# Patient Record
Sex: Male | Born: 1950
Health system: Southern US, Community
[De-identification: ages and names within clinical notes are randomized; demographics above are authoritative.]

## PROBLEM LIST (undated history)

## (undated) DIAGNOSIS — F419 Anxiety disorder, unspecified: Secondary | ICD-10-CM

## (undated) HISTORY — PX: FINGER AMPUTATION: SHX636

---

## 2005-05-16 ENCOUNTER — Inpatient Hospital Stay: Payer: Self-pay | Admitting: Internal Medicine

## 2005-05-16 ENCOUNTER — Other Ambulatory Visit: Payer: Self-pay

## 2007-04-05 ENCOUNTER — Ambulatory Visit: Payer: Self-pay | Admitting: Internal Medicine

## 2009-01-27 ENCOUNTER — Ambulatory Visit: Payer: Self-pay | Admitting: Nephrology

## 2011-08-16 DIAGNOSIS — B351 Tinea unguium: Secondary | ICD-10-CM | POA: Diagnosis not present

## 2011-08-16 DIAGNOSIS — M779 Enthesopathy, unspecified: Secondary | ICD-10-CM | POA: Diagnosis not present

## 2011-11-06 DIAGNOSIS — Z20828 Contact with and (suspected) exposure to other viral communicable diseases: Secondary | ICD-10-CM | POA: Diagnosis not present

## 2011-11-20 DIAGNOSIS — B351 Tinea unguium: Secondary | ICD-10-CM | POA: Diagnosis not present

## 2011-11-20 DIAGNOSIS — L97509 Non-pressure chronic ulcer of other part of unspecified foot with unspecified severity: Secondary | ICD-10-CM | POA: Diagnosis not present

## 2013-08-05 DIAGNOSIS — Z79899 Other long term (current) drug therapy: Secondary | ICD-10-CM | POA: Diagnosis not present

## 2013-11-12 DIAGNOSIS — F17200 Nicotine dependence, unspecified, uncomplicated: Secondary | ICD-10-CM | POA: Diagnosis not present

## 2013-11-12 DIAGNOSIS — IMO0001 Reserved for inherently not codable concepts without codable children: Secondary | ICD-10-CM | POA: Diagnosis not present

## 2014-06-18 DIAGNOSIS — Z720 Tobacco use: Secondary | ICD-10-CM | POA: Diagnosis not present

## 2014-06-18 DIAGNOSIS — S68119A Complete traumatic metacarpophalangeal amputation of unspecified finger, initial encounter: Secondary | ICD-10-CM | POA: Diagnosis not present

## 2015-01-20 DIAGNOSIS — I739 Peripheral vascular disease, unspecified: Secondary | ICD-10-CM | POA: Diagnosis not present

## 2015-01-20 DIAGNOSIS — E785 Hyperlipidemia, unspecified: Secondary | ICD-10-CM | POA: Diagnosis not present

## 2015-01-20 DIAGNOSIS — R0602 Shortness of breath: Secondary | ICD-10-CM | POA: Diagnosis not present

## 2015-01-20 DIAGNOSIS — Z720 Tobacco use: Secondary | ICD-10-CM | POA: Diagnosis not present

## 2015-02-12 DIAGNOSIS — R0602 Shortness of breath: Secondary | ICD-10-CM | POA: Diagnosis not present

## 2015-02-19 DIAGNOSIS — R0602 Shortness of breath: Secondary | ICD-10-CM | POA: Diagnosis not present

## 2015-02-19 DIAGNOSIS — E559 Vitamin D deficiency, unspecified: Secondary | ICD-10-CM | POA: Diagnosis not present

## 2015-02-19 DIAGNOSIS — E785 Hyperlipidemia, unspecified: Secondary | ICD-10-CM | POA: Diagnosis not present

## 2015-02-19 DIAGNOSIS — R7309 Other abnormal glucose: Secondary | ICD-10-CM | POA: Diagnosis not present

## 2015-02-19 DIAGNOSIS — Z720 Tobacco use: Secondary | ICD-10-CM | POA: Diagnosis not present

## 2015-02-19 DIAGNOSIS — I739 Peripheral vascular disease, unspecified: Secondary | ICD-10-CM | POA: Diagnosis not present

## 2015-05-11 DIAGNOSIS — E559 Vitamin D deficiency, unspecified: Secondary | ICD-10-CM | POA: Diagnosis not present

## 2015-05-11 DIAGNOSIS — R7301 Impaired fasting glucose: Secondary | ICD-10-CM | POA: Diagnosis not present

## 2015-05-11 DIAGNOSIS — I10 Essential (primary) hypertension: Secondary | ICD-10-CM | POA: Diagnosis not present

## 2015-05-11 DIAGNOSIS — R0602 Shortness of breath: Secondary | ICD-10-CM | POA: Diagnosis not present

## 2015-05-11 DIAGNOSIS — Z720 Tobacco use: Secondary | ICD-10-CM | POA: Diagnosis not present

## 2015-05-11 DIAGNOSIS — J45909 Unspecified asthma, uncomplicated: Secondary | ICD-10-CM | POA: Diagnosis not present

## 2015-05-11 DIAGNOSIS — E785 Hyperlipidemia, unspecified: Secondary | ICD-10-CM | POA: Diagnosis not present

## 2015-05-11 DIAGNOSIS — Z23 Encounter for immunization: Secondary | ICD-10-CM | POA: Diagnosis not present

## 2015-05-11 DIAGNOSIS — R7309 Other abnormal glucose: Secondary | ICD-10-CM | POA: Diagnosis not present

## 2015-05-11 DIAGNOSIS — E7211 Homocystinuria: Secondary | ICD-10-CM | POA: Diagnosis not present

## 2015-06-18 DIAGNOSIS — E7211 Homocystinuria: Secondary | ICD-10-CM | POA: Diagnosis not present

## 2015-06-18 DIAGNOSIS — R7301 Impaired fasting glucose: Secondary | ICD-10-CM | POA: Diagnosis not present

## 2015-06-18 DIAGNOSIS — E785 Hyperlipidemia, unspecified: Secondary | ICD-10-CM | POA: Diagnosis not present

## 2015-06-18 DIAGNOSIS — Z720 Tobacco use: Secondary | ICD-10-CM | POA: Diagnosis not present

## 2015-06-18 DIAGNOSIS — I10 Essential (primary) hypertension: Secondary | ICD-10-CM | POA: Diagnosis not present

## 2015-06-18 DIAGNOSIS — J45909 Unspecified asthma, uncomplicated: Secondary | ICD-10-CM | POA: Diagnosis not present

## 2015-06-18 DIAGNOSIS — E559 Vitamin D deficiency, unspecified: Secondary | ICD-10-CM | POA: Diagnosis not present

## 2015-10-07 DIAGNOSIS — R7303 Prediabetes: Secondary | ICD-10-CM | POA: Diagnosis not present

## 2015-10-07 DIAGNOSIS — J45909 Unspecified asthma, uncomplicated: Secondary | ICD-10-CM | POA: Diagnosis not present

## 2015-10-07 DIAGNOSIS — E7211 Homocystinuria: Secondary | ICD-10-CM | POA: Diagnosis not present

## 2015-10-07 DIAGNOSIS — M545 Low back pain: Secondary | ICD-10-CM | POA: Diagnosis not present

## 2015-10-07 DIAGNOSIS — E785 Hyperlipidemia, unspecified: Secondary | ICD-10-CM | POA: Diagnosis not present

## 2015-10-07 DIAGNOSIS — I10 Essential (primary) hypertension: Secondary | ICD-10-CM | POA: Diagnosis not present

## 2015-10-07 DIAGNOSIS — E559 Vitamin D deficiency, unspecified: Secondary | ICD-10-CM | POA: Diagnosis not present

## 2015-10-07 DIAGNOSIS — Z720 Tobacco use: Secondary | ICD-10-CM | POA: Diagnosis not present

## 2016-08-03 DIAGNOSIS — I10 Essential (primary) hypertension: Secondary | ICD-10-CM | POA: Diagnosis not present

## 2016-08-03 DIAGNOSIS — E559 Vitamin D deficiency, unspecified: Secondary | ICD-10-CM | POA: Diagnosis not present

## 2016-08-03 DIAGNOSIS — Z131 Encounter for screening for diabetes mellitus: Secondary | ICD-10-CM | POA: Diagnosis not present

## 2016-08-03 DIAGNOSIS — Z720 Tobacco use: Secondary | ICD-10-CM | POA: Diagnosis not present

## 2016-08-03 DIAGNOSIS — Z23 Encounter for immunization: Secondary | ICD-10-CM | POA: Diagnosis not present

## 2016-08-03 DIAGNOSIS — Z5181 Encounter for therapeutic drug level monitoring: Secondary | ICD-10-CM | POA: Diagnosis not present

## 2016-08-03 DIAGNOSIS — H538 Other visual disturbances: Secondary | ICD-10-CM | POA: Diagnosis not present

## 2016-08-03 DIAGNOSIS — Z0000 Encounter for general adult medical examination without abnormal findings: Secondary | ICD-10-CM | POA: Diagnosis not present

## 2016-08-03 DIAGNOSIS — J449 Chronic obstructive pulmonary disease, unspecified: Secondary | ICD-10-CM | POA: Diagnosis not present

## 2016-08-03 DIAGNOSIS — E785 Hyperlipidemia, unspecified: Secondary | ICD-10-CM | POA: Diagnosis not present

## 2016-08-03 DIAGNOSIS — Z113 Encounter for screening for infections with a predominantly sexual mode of transmission: Secondary | ICD-10-CM | POA: Diagnosis not present

## 2016-08-03 DIAGNOSIS — R7303 Prediabetes: Secondary | ICD-10-CM | POA: Diagnosis not present

## 2016-08-25 DIAGNOSIS — E7211 Homocystinuria: Secondary | ICD-10-CM | POA: Diagnosis not present

## 2016-08-25 DIAGNOSIS — M545 Low back pain: Secondary | ICD-10-CM | POA: Diagnosis not present

## 2016-08-25 DIAGNOSIS — E559 Vitamin D deficiency, unspecified: Secondary | ICD-10-CM | POA: Diagnosis not present

## 2016-08-25 DIAGNOSIS — I10 Essential (primary) hypertension: Secondary | ICD-10-CM | POA: Diagnosis not present

## 2016-08-25 DIAGNOSIS — Z720 Tobacco use: Secondary | ICD-10-CM | POA: Diagnosis not present

## 2016-08-25 DIAGNOSIS — E785 Hyperlipidemia, unspecified: Secondary | ICD-10-CM | POA: Diagnosis not present

## 2016-08-25 DIAGNOSIS — R7303 Prediabetes: Secondary | ICD-10-CM | POA: Diagnosis not present

## 2016-08-25 DIAGNOSIS — J45909 Unspecified asthma, uncomplicated: Secondary | ICD-10-CM | POA: Diagnosis not present

## 2016-08-25 DIAGNOSIS — R0600 Dyspnea, unspecified: Secondary | ICD-10-CM | POA: Diagnosis not present

## 2016-09-04 DIAGNOSIS — G44221 Chronic tension-type headache, intractable: Secondary | ICD-10-CM | POA: Diagnosis not present

## 2016-09-11 DIAGNOSIS — R51 Headache: Secondary | ICD-10-CM | POA: Diagnosis not present

## 2016-09-15 DIAGNOSIS — R7303 Prediabetes: Secondary | ICD-10-CM | POA: Diagnosis not present

## 2016-09-15 DIAGNOSIS — E7211 Homocystinuria: Secondary | ICD-10-CM | POA: Diagnosis not present

## 2016-09-15 DIAGNOSIS — M545 Low back pain: Secondary | ICD-10-CM | POA: Diagnosis not present

## 2016-09-15 DIAGNOSIS — Z720 Tobacco use: Secondary | ICD-10-CM | POA: Diagnosis not present

## 2016-09-15 DIAGNOSIS — E559 Vitamin D deficiency, unspecified: Secondary | ICD-10-CM | POA: Diagnosis not present

## 2016-09-15 DIAGNOSIS — J45909 Unspecified asthma, uncomplicated: Secondary | ICD-10-CM | POA: Diagnosis not present

## 2016-09-15 DIAGNOSIS — I10 Essential (primary) hypertension: Secondary | ICD-10-CM | POA: Diagnosis not present

## 2016-09-15 DIAGNOSIS — E785 Hyperlipidemia, unspecified: Secondary | ICD-10-CM | POA: Diagnosis not present

## 2016-10-31 DIAGNOSIS — G44221 Chronic tension-type headache, intractable: Secondary | ICD-10-CM | POA: Diagnosis not present

## 2017-06-14 DIAGNOSIS — Z01118 Encounter for examination of ears and hearing with other abnormal findings: Secondary | ICD-10-CM | POA: Diagnosis not present

## 2017-06-14 DIAGNOSIS — H538 Other visual disturbances: Secondary | ICD-10-CM | POA: Diagnosis not present

## 2017-06-14 DIAGNOSIS — Z720 Tobacco use: Secondary | ICD-10-CM | POA: Diagnosis not present

## 2017-06-14 DIAGNOSIS — Z113 Encounter for screening for infections with a predominantly sexual mode of transmission: Secondary | ICD-10-CM | POA: Diagnosis not present

## 2017-06-14 DIAGNOSIS — Z5181 Encounter for therapeutic drug level monitoring: Secondary | ICD-10-CM | POA: Diagnosis not present

## 2017-06-14 DIAGNOSIS — E785 Hyperlipidemia, unspecified: Secondary | ICD-10-CM | POA: Diagnosis not present

## 2017-06-14 DIAGNOSIS — Z136 Encounter for screening for cardiovascular disorders: Secondary | ICD-10-CM | POA: Diagnosis not present

## 2017-06-14 DIAGNOSIS — R7303 Prediabetes: Secondary | ICD-10-CM | POA: Diagnosis not present

## 2017-06-14 DIAGNOSIS — Z131 Encounter for screening for diabetes mellitus: Secondary | ICD-10-CM | POA: Diagnosis not present

## 2017-08-06 DIAGNOSIS — R7303 Prediabetes: Secondary | ICD-10-CM | POA: Diagnosis not present

## 2017-08-06 DIAGNOSIS — Z0000 Encounter for general adult medical examination without abnormal findings: Secondary | ICD-10-CM | POA: Diagnosis not present

## 2017-08-06 DIAGNOSIS — E785 Hyperlipidemia, unspecified: Secondary | ICD-10-CM | POA: Diagnosis not present

## 2017-08-06 DIAGNOSIS — Z720 Tobacco use: Secondary | ICD-10-CM | POA: Diagnosis not present

## 2017-09-04 DIAGNOSIS — Z720 Tobacco use: Secondary | ICD-10-CM | POA: Diagnosis not present

## 2017-09-04 DIAGNOSIS — M25512 Pain in left shoulder: Secondary | ICD-10-CM | POA: Diagnosis not present

## 2017-09-04 DIAGNOSIS — R7303 Prediabetes: Secondary | ICD-10-CM | POA: Diagnosis not present

## 2017-09-04 DIAGNOSIS — E785 Hyperlipidemia, unspecified: Secondary | ICD-10-CM | POA: Diagnosis not present

## 2017-09-05 ENCOUNTER — Emergency Department (HOSPITAL_COMMUNITY)
Admission: EM | Admit: 2017-09-05 | Discharge: 2017-09-05 | Disposition: A | Discharge status: Home / Self Care | Payer: Medicare Other | Attending: Emergency Medicine | Admitting: Emergency Medicine

## 2017-09-05 ENCOUNTER — Emergency Department (HOSPITAL_COMMUNITY): Payer: Medicare Other

## 2017-09-05 ENCOUNTER — Encounter (HOSPITAL_COMMUNITY): Payer: Self-pay | Admitting: Emergency Medicine

## 2017-09-05 DIAGNOSIS — M25512 Pain in left shoulder: Secondary | ICD-10-CM | POA: Diagnosis not present

## 2017-09-05 DIAGNOSIS — R030 Elevated blood-pressure reading, without diagnosis of hypertension: Secondary | ICD-10-CM | POA: Diagnosis not present

## 2017-09-05 DIAGNOSIS — M79602 Pain in left arm: Secondary | ICD-10-CM | POA: Diagnosis present

## 2017-09-05 DIAGNOSIS — F17210 Nicotine dependence, cigarettes, uncomplicated: Secondary | ICD-10-CM | POA: Diagnosis not present

## 2017-09-05 DIAGNOSIS — M79641 Pain in right hand: Secondary | ICD-10-CM | POA: Diagnosis not present

## 2017-09-05 DIAGNOSIS — M79622 Pain in left upper arm: Secondary | ICD-10-CM | POA: Diagnosis not present

## 2017-09-05 MED ORDER — ACETAMINOPHEN 500 MG PO TABS
500.0000 mg | ORAL_TABLET | Freq: Once | ORAL | Status: AC
  Administered 2017-09-05: 500 mg via ORAL
  Filled 2017-09-05: qty 1

## 2017-09-05 NOTE — Discharge Instructions (Signed)
Schedule an appointment with your primary care provider to follow up on your ongoing pain.  Take tylenol every 4 hours as needed for pain. Apply ice or heat as needed for pain.

## 2017-09-05 NOTE — ED Provider Notes (Signed)
[facility]   CSN: [id number] Arrival date & time: 09/05/17  0205     History   Chief Complaint Chief Complaint  Patient presents with  . Hand Pain  . Arm Pain    HPI Chase Carter is a 67 y.o. male sending to the ED for chronic left arm and right hand pain. States pain is worse with movement and palpation. Patient states he was seen by his PCP earlier in the day, however was unable to report any recommendations.  He denies fever or chills,   Has not tried any medications for his symptoms.  No recent injuries or wounds.  The history is provided by the patient.    History reviewed. No pertinent past medical history.  There are no active problems to display for this patient.   History reviewed. No pertinent surgical history.     Home Medications    Prior to Admission medications   Not on File    Family History No family history on file.  Social History Social History   Tobacco Use  . Smoking status: Current Every Day Smoker  . Smokeless tobacco: Never Used  Substance Use Topics  . Alcohol use: Yes  . Drug use: Yes     Allergies   Patient has no known allergies.   Review of Systems Review of Systems  Constitutional: Negative for fever.  Musculoskeletal: Positive for myalgias.  Skin: Negative for color change and wound.  All other systems reviewed and are negative.    Physical Exam Updated Vital Signs BP (!) 143/96 (BP Location: Right Arm)   Pulse 76   Temp 98 F (36.7 C) (Oral)   Resp 18   Ht 6\' 1"  (1.854 m)   Wt 76.2 kg (168 lb)   SpO2 100%   BMI 22.16 kg/m   Physical Exam  Constitutional: He appears well-developed and well-nourished. No distress.  HENT:  Head: Normocephalic and atraumatic.  Eyes: Conjunctivae are normal.  Cardiovascular: Normal rate and intact distal pulses.  Pulmonary/Chest: Effort normal.  Musculoskeletal:  Old Partial amputations of digits 3-5 on right  hand. No erythema, edema, or warmth. Nl ROM. Left arm without tenderness or deformity. Normal ROM of shoulder, elbow and wrist. Normal sensation  Psychiatric: He has a normal mood and affect. His behavior is normal.  Nursing note and vitals reviewed.    ED Treatments / Results  Labs (all labs ordered are listed, but only abnormal results are displayed) Labs Reviewed - No data to display  EKG  EKG Interpretation None       Radiology Dg Humerus Left  Result Date: 09/05/2017 CLINICAL DATA:  Left arm pain for several months EXAM: LEFT HUMERUS - 2+ VIEW COMPARISON:  None. FINDINGS: There is no evidence of fracture or other focal bone lesions. Soft tissues are unremarkable. Glenohumeral degenerative change. IMPRESSION: Negative. Electronically Signed   By: Jasmine PangKim  Fujinaga M.D.   On: 09/05/2017 02:53   Dg Hand Complete Right  Result Date: 09/05/2017 CLINICAL DATA:  Pain for several months EXAM: RIGHT HAND - COMPLETE 3+ VIEW COMPARISON:  None. FINDINGS: No acute displaced fracture or malalignment is seen. Partial amputation of the second through fifth digits, at the level of the proximal distal phalanx of the second digit and the proximal aspect of the middle phalanges third through fifth digits. Punctate densities are present within the distal fifth digit soft tissues. No periostitis. No soft tissue gas. IMPRESSION: 1. No acute osseous abnormality 2. Prior  partial amputation of the second through fifth digits. Electronically Signed   By: Jasmine Pang M.D.   On: 09/05/2017 02:52    Procedures Procedures (including critical care time)  Medications Ordered in ED Medications  acetaminophen (TYLENOL) tablet 500 mg (500 mg Oral Given 09/05/17 0405)     Initial Impression / Assessment and Plan / ED Course  I have reviewed the triage vital signs and the nursing notes.  Pertinent labs & imaging results that were available during my care of the patient were reviewed by me and considered in my  medical decision making (see chart for details).     Patient with chronic right hand and left arm pain.  She was just seen by his PCP on Tuesday afternoon.  No new injuries.  No wounds.  Exam reassuring, with normal range of motion.  X-rays negative for acute pathology.  Discussed symptomatic management for pain, and PCP follow-up.  Pain treated in the ED with Tylenol.  Safe for discharge.  Discussed with Dr. Nicanor Alcon.  Discussed results, findings, treatment and follow up. Patient advised of return precautions. Patient verbalized understanding and agreed with plan.   Final Clinical Impressions(s) / ED Diagnoses   Final diagnoses:  Left upper arm pain  Right hand pain    ED Discharge Orders    None       Tateanna Bach, [place] N, PA-C 09/05/17 0981    Palumbo, April, MD 09/05/17 1914

## 2017-09-05 NOTE — ED Triage Notes (Signed)
Pt reports R hand pain and L arm pain xseveral months. States he's tired of it and wants to be checked out. States off his psych meds, using crack.

## 2017-09-05 NOTE — ED Notes (Signed)
Patient c/o pain in right hand, states he has an accident years ago and amputated several fingers, tips of fingers dark.

## 2017-09-09 ENCOUNTER — Emergency Department (HOSPITAL_COMMUNITY)
Admission: EM | Admit: 2017-09-09 | Discharge: 2017-09-10 | Disposition: A | Discharge status: Home / Self Care | Payer: Medicare Other | Attending: Emergency Medicine | Admitting: Emergency Medicine

## 2017-09-09 DIAGNOSIS — R252 Cramp and spasm: Secondary | ICD-10-CM | POA: Diagnosis not present

## 2017-09-09 DIAGNOSIS — M79641 Pain in right hand: Secondary | ICD-10-CM | POA: Diagnosis not present

## 2017-09-09 DIAGNOSIS — M79642 Pain in left hand: Secondary | ICD-10-CM | POA: Diagnosis not present

## 2017-09-09 DIAGNOSIS — M79643 Pain in unspecified hand: Secondary | ICD-10-CM | POA: Diagnosis not present

## 2017-09-09 DIAGNOSIS — F17200 Nicotine dependence, unspecified, uncomplicated: Secondary | ICD-10-CM | POA: Diagnosis not present

## 2017-09-09 DIAGNOSIS — R52 Pain, unspecified: Secondary | ICD-10-CM | POA: Diagnosis not present

## 2017-09-09 NOTE — ED Triage Notes (Signed)
Pt transported from home by EMS for c/o bilat hand pain, pt does have old injury. Good CMS per EMS. Pt seen for same a few days ago.

## 2017-09-10 ENCOUNTER — Encounter (HOSPITAL_COMMUNITY): Payer: Self-pay | Admitting: Emergency Medicine

## 2017-09-10 DIAGNOSIS — M79642 Pain in left hand: Secondary | ICD-10-CM | POA: Diagnosis not present

## 2017-09-10 DIAGNOSIS — M79641 Pain in right hand: Secondary | ICD-10-CM | POA: Diagnosis not present

## 2017-09-10 DIAGNOSIS — R252 Cramp and spasm: Secondary | ICD-10-CM | POA: Diagnosis not present

## 2017-09-10 LAB — I-STAT CHEM 8, ED
BUN: 4 mg/dL — AB (ref 6–20)
CALCIUM ION: 1.09 mmol/L — AB (ref 1.15–1.40)
CREATININE: 1.1 mg/dL (ref 0.61–1.24)
Chloride: 101 mmol/L (ref 101–111)
Glucose, Bld: 82 mg/dL (ref 65–99)
HEMATOCRIT: 44 % (ref 39.0–52.0)
Hemoglobin: 15 g/dL (ref 13.0–17.0)
Potassium: 3.8 mmol/L (ref 3.5–5.1)
Sodium: 140 mmol/L (ref 135–145)
TCO2: 27 mmol/L (ref 22–32)

## 2017-09-10 NOTE — Discharge Instructions (Signed)
Make sure to stay hydrated.  Can take tylenol/motrin as needed for pain/cramping. Follow-up with your primary care doctor. Return here for new concerns.

## 2017-09-10 NOTE — ED Provider Notes (Signed)
[facility]   CSN: [id number] Arrival date & time: 09/09/17  2343     History   Chief Complaint Chief Complaint  Patient presents with  . Hand Pain    HPI Kieth BrightlyRoosevelt Dittmar is a 67 y.o. male.  The history is provided by the patient and medical records.     67 year old male here with bilateral hand cramping.  Has history of chronic hand pain, has seen PCP for this recently.  States he was sitting outside on the porch and got pretty severe cramps in both hands.  He thought this was due to the cold so he went back inside and had continued cramping.  States he thought it was because he has not been eating enough salt so he went and ate some potato chips.  States he is just concerned about his hand cramping.  States he has been drinking plenty of fluids lately.  He is not sure if he is ever had any history of hypokalemia.  He denies any fever or chills.  No chest pain or shortness of breath.  History reviewed. No pertinent past medical history.  There are no active problems to display for this patient.   Past Surgical History:  Procedure Laterality Date  . FINGER AMPUTATION         Home Medications    Prior to Admission medications   Not on File    Family History No family history on file.  Social History Social History   Tobacco Use  . Smoking status: Current Every Day Smoker  . Smokeless tobacco: Never Used  Substance Use Topics  . Alcohol use: Yes  . Drug use: Yes     Allergies   Patient has no known allergies.   Review of Systems Review of Systems  Musculoskeletal: Positive for arthralgias.  All other systems reviewed and are negative.    Physical Exam Updated Vital Signs BP (!) 132/92 (BP Location: Right Arm)   Pulse 80   Resp 15   Ht 6\' 1"  (1.854 m)   Wt 76.2 kg (168 lb)   SpO2 100%   BMI 22.16 kg/m   Physical Exam  Constitutional: He is oriented to person, place, and time. He  appears well-developed and well-nourished.  HENT:  Head: Normocephalic and atraumatic.  Mouth/Throat: Oropharynx is clear and moist.  No dentition  Eyes: Conjunctivae and EOM are normal. Pupils are equal, round, and reactive to light.  Neck: Normal range of motion.  Cardiovascular: Normal rate, regular rhythm and normal heart sounds.  Pulmonary/Chest: Effort normal and breath sounds normal.  Abdominal: Soft. Bowel sounds are normal.  Musculoskeletal: Normal range of motion.  Right hand with amputations of distal phalanx of 3rd, 4th, and 5th digits No acute deformities noted of either hand, no signs of trauma, able to flex and extend his fingers without issue; able to make fists and grip objects with both hands; able to push/pull against resistance; no wrist drop; radial pulses intact bilaterally  Neurological: He is alert and oriented to person, place, and time.  Skin: Skin is warm and dry.  Psychiatric: He has a normal mood and affect.  Nursing note and vitals reviewed.    ED Treatments / Results  Labs (all labs ordered are listed, but only abnormal results are displayed) Labs Reviewed  I-STAT CHEM 8, ED - Abnormal; Notable for the following components:      Result Value   BUN 4 (*)    Calcium, Ion  1.09 (*)    All other components within normal limits    EKG  EKG Interpretation None       Radiology No results found.  Procedures Procedures (including critical care time)  Medications Ordered in ED Medications - No data to display   Initial Impression / Assessment and Plan / ED Course  I have reviewed the triage vital signs and the nursing notes.  Pertinent labs & imaging results that were available during my care of the patient were reviewed by me and considered in my medical decision making (see chart for details).  67 year old male here with bilateral hand cramping.  He has no focal numbness, weakness, or other neurologic deficit on exam.  He does have some  degree of chronic hand pain due to multiple amputations of his fingers of the right hand.  There is no apparent wrist drop and has no noted spasm, tremor, or other irregular movements of the hand.  Chemistry panel was obtained, electrolytes are overall normal.  He does not appear dehydrated.  Suspect this may be acute on chronic pain.  Feel he can be discharged home with supportive care measures.  Follow-up with PCP.  Discussed plan with patient, he acknowledged understanding and agreed with plan of care.  Return precautions given for new or worsening symptoms.  Final Clinical Impressions(s) / ED Diagnoses   Final diagnoses:  Cramping of hands    ED Discharge Orders    None       Garlon Hatchet, PA-C 09/10/17 0300    Devoria Albe, MD 09/10/17 [phone]

## 2017-10-01 DIAGNOSIS — J45909 Unspecified asthma, uncomplicated: Secondary | ICD-10-CM | POA: Diagnosis not present

## 2017-10-01 DIAGNOSIS — M25562 Pain in left knee: Secondary | ICD-10-CM | POA: Diagnosis not present

## 2017-10-01 DIAGNOSIS — E785 Hyperlipidemia, unspecified: Secondary | ICD-10-CM | POA: Diagnosis not present

## 2017-10-01 DIAGNOSIS — I10 Essential (primary) hypertension: Secondary | ICD-10-CM | POA: Diagnosis not present

## 2017-10-01 DIAGNOSIS — R7303 Prediabetes: Secondary | ICD-10-CM | POA: Diagnosis not present

## 2017-10-01 DIAGNOSIS — F209 Schizophrenia, unspecified: Secondary | ICD-10-CM | POA: Diagnosis not present

## 2017-10-01 DIAGNOSIS — Z720 Tobacco use: Secondary | ICD-10-CM | POA: Diagnosis not present

## 2017-10-23 DIAGNOSIS — Z113 Encounter for screening for infections with a predominantly sexual mode of transmission: Secondary | ICD-10-CM | POA: Diagnosis not present

## 2017-10-23 DIAGNOSIS — Z5181 Encounter for therapeutic drug level monitoring: Secondary | ICD-10-CM | POA: Diagnosis not present

## 2017-10-23 DIAGNOSIS — Z131 Encounter for screening for diabetes mellitus: Secondary | ICD-10-CM | POA: Diagnosis not present

## 2017-10-23 DIAGNOSIS — E785 Hyperlipidemia, unspecified: Secondary | ICD-10-CM | POA: Diagnosis not present

## 2017-10-23 DIAGNOSIS — I10 Essential (primary) hypertension: Secondary | ICD-10-CM | POA: Diagnosis not present

## 2017-10-23 DIAGNOSIS — Z01118 Encounter for examination of ears and hearing with other abnormal findings: Secondary | ICD-10-CM | POA: Diagnosis not present

## 2017-10-23 DIAGNOSIS — Z720 Tobacco use: Secondary | ICD-10-CM | POA: Diagnosis not present

## 2017-10-23 DIAGNOSIS — R7303 Prediabetes: Secondary | ICD-10-CM | POA: Diagnosis not present

## 2017-10-23 DIAGNOSIS — J45909 Unspecified asthma, uncomplicated: Secondary | ICD-10-CM | POA: Diagnosis not present

## 2017-10-23 DIAGNOSIS — M25562 Pain in left knee: Secondary | ICD-10-CM | POA: Diagnosis not present

## 2017-11-01 DIAGNOSIS — E785 Hyperlipidemia, unspecified: Secondary | ICD-10-CM | POA: Diagnosis not present

## 2017-11-01 DIAGNOSIS — E875 Hyperkalemia: Secondary | ICD-10-CM | POA: Diagnosis not present

## 2017-11-01 DIAGNOSIS — R51 Headache: Secondary | ICD-10-CM | POA: Diagnosis not present

## 2017-11-01 DIAGNOSIS — R7303 Prediabetes: Secondary | ICD-10-CM | POA: Diagnosis not present

## 2017-11-01 DIAGNOSIS — Z720 Tobacco use: Secondary | ICD-10-CM | POA: Diagnosis not present

## 2017-11-01 DIAGNOSIS — I10 Essential (primary) hypertension: Secondary | ICD-10-CM | POA: Diagnosis not present

## 2017-11-01 DIAGNOSIS — J45909 Unspecified asthma, uncomplicated: Secondary | ICD-10-CM | POA: Diagnosis not present

## 2017-11-06 ENCOUNTER — Emergency Department (HOSPITAL_COMMUNITY)
Admission: EM | Admit: 2017-11-06 | Discharge: 2017-11-06 | Disposition: A | Discharge status: Home / Self Care | Payer: Medicare Other | Attending: Emergency Medicine | Admitting: Emergency Medicine

## 2017-11-06 ENCOUNTER — Encounter (HOSPITAL_COMMUNITY): Payer: Self-pay | Admitting: Emergency Medicine

## 2017-11-06 ENCOUNTER — Other Ambulatory Visit: Payer: Self-pay

## 2017-11-06 DIAGNOSIS — Y939 Activity, unspecified: Secondary | ICD-10-CM | POA: Insufficient documentation

## 2017-11-06 DIAGNOSIS — W540XXA Bitten by dog, initial encounter: Secondary | ICD-10-CM | POA: Diagnosis not present

## 2017-11-06 DIAGNOSIS — Y999 Unspecified external cause status: Secondary | ICD-10-CM | POA: Diagnosis not present

## 2017-11-06 DIAGNOSIS — S60571A Other superficial bite of hand of right hand, initial encounter: Secondary | ICD-10-CM | POA: Insufficient documentation

## 2017-11-06 DIAGNOSIS — Z5321 Procedure and treatment not carried out due to patient leaving prior to being seen by health care provider: Secondary | ICD-10-CM | POA: Insufficient documentation

## 2017-11-06 DIAGNOSIS — Y929 Unspecified place or not applicable: Secondary | ICD-10-CM | POA: Insufficient documentation

## 2017-11-06 HISTORY — DX: Anxiety disorder, unspecified: F41.9

## 2017-11-06 NOTE — ED Triage Notes (Signed)
Pt states he was bitten by a dog last night on his right wrist and right thumb. Site has scabbed over. No drainage noted. Pt states he is here for rabies injection.

## 2017-11-27 DIAGNOSIS — Z720 Tobacco use: Secondary | ICD-10-CM | POA: Diagnosis not present

## 2017-11-27 DIAGNOSIS — R51 Headache: Secondary | ICD-10-CM | POA: Diagnosis not present

## 2017-11-27 DIAGNOSIS — I10 Essential (primary) hypertension: Secondary | ICD-10-CM | POA: Diagnosis not present

## 2017-11-27 DIAGNOSIS — J45909 Unspecified asthma, uncomplicated: Secondary | ICD-10-CM | POA: Diagnosis not present

## 2017-11-27 DIAGNOSIS — R7303 Prediabetes: Secondary | ICD-10-CM | POA: Diagnosis not present

## 2017-11-27 DIAGNOSIS — E875 Hyperkalemia: Secondary | ICD-10-CM | POA: Diagnosis not present

## 2017-11-27 DIAGNOSIS — E785 Hyperlipidemia, unspecified: Secondary | ICD-10-CM | POA: Diagnosis not present

## 2018-01-03 DIAGNOSIS — E785 Hyperlipidemia, unspecified: Secondary | ICD-10-CM | POA: Diagnosis not present

## 2018-01-03 DIAGNOSIS — I10 Essential (primary) hypertension: Secondary | ICD-10-CM | POA: Diagnosis not present

## 2018-01-03 DIAGNOSIS — R51 Headache: Secondary | ICD-10-CM | POA: Diagnosis not present

## 2018-01-03 DIAGNOSIS — R7303 Prediabetes: Secondary | ICD-10-CM | POA: Diagnosis not present

## 2018-01-03 DIAGNOSIS — Z720 Tobacco use: Secondary | ICD-10-CM | POA: Diagnosis not present

## 2018-01-03 DIAGNOSIS — E875 Hyperkalemia: Secondary | ICD-10-CM | POA: Diagnosis not present

## 2018-01-03 DIAGNOSIS — J45909 Unspecified asthma, uncomplicated: Secondary | ICD-10-CM | POA: Diagnosis not present

## 2018-01-21 DIAGNOSIS — J45909 Unspecified asthma, uncomplicated: Secondary | ICD-10-CM | POA: Diagnosis not present

## 2018-01-21 DIAGNOSIS — R51 Headache: Secondary | ICD-10-CM | POA: Diagnosis not present

## 2018-01-21 DIAGNOSIS — E785 Hyperlipidemia, unspecified: Secondary | ICD-10-CM | POA: Diagnosis not present

## 2018-01-21 DIAGNOSIS — I10 Essential (primary) hypertension: Secondary | ICD-10-CM | POA: Diagnosis not present

## 2018-01-21 DIAGNOSIS — R7303 Prediabetes: Secondary | ICD-10-CM | POA: Diagnosis not present

## 2018-01-21 DIAGNOSIS — Z720 Tobacco use: Secondary | ICD-10-CM | POA: Diagnosis not present

## 2018-04-17 DIAGNOSIS — F3181 Bipolar II disorder: Secondary | ICD-10-CM | POA: Diagnosis not present

## 2018-05-01 DIAGNOSIS — F3181 Bipolar II disorder: Secondary | ICD-10-CM | POA: Diagnosis not present

## 2018-05-03 DIAGNOSIS — F3181 Bipolar II disorder: Secondary | ICD-10-CM | POA: Diagnosis not present

## 2018-05-06 DIAGNOSIS — F3181 Bipolar II disorder: Secondary | ICD-10-CM | POA: Diagnosis not present

## 2018-05-10 DIAGNOSIS — F3181 Bipolar II disorder: Secondary | ICD-10-CM | POA: Diagnosis not present

## 2018-05-15 DIAGNOSIS — F3181 Bipolar II disorder: Secondary | ICD-10-CM | POA: Diagnosis not present

## 2018-05-20 DIAGNOSIS — F3181 Bipolar II disorder: Secondary | ICD-10-CM | POA: Diagnosis not present

## 2018-05-24 DIAGNOSIS — F3181 Bipolar II disorder: Secondary | ICD-10-CM | POA: Diagnosis not present

## 2018-05-31 DIAGNOSIS — F3181 Bipolar II disorder: Secondary | ICD-10-CM | POA: Diagnosis not present

## 2018-07-13 ENCOUNTER — Other Ambulatory Visit: Payer: Self-pay

## 2018-07-13 ENCOUNTER — Observation Stay (HOSPITAL_COMMUNITY)
Admission: EM | Admit: 2018-07-13 | Discharge: 2018-07-14 | Discharge status: Against Medical Advice | Payer: Medicare Other | Attending: Internal Medicine | Admitting: Internal Medicine

## 2018-07-13 ENCOUNTER — Emergency Department (HOSPITAL_COMMUNITY): Payer: Medicare Other

## 2018-07-13 DIAGNOSIS — R0902 Hypoxemia: Secondary | ICD-10-CM | POA: Diagnosis not present

## 2018-07-13 DIAGNOSIS — R079 Chest pain, unspecified: Secondary | ICD-10-CM | POA: Diagnosis not present

## 2018-07-13 DIAGNOSIS — R0789 Other chest pain: Secondary | ICD-10-CM | POA: Diagnosis not present

## 2018-07-13 DIAGNOSIS — J80 Acute respiratory distress syndrome: Secondary | ICD-10-CM | POA: Diagnosis not present

## 2018-07-13 DIAGNOSIS — Z5329 Procedure and treatment not carried out because of patient's decision for other reasons: Secondary | ICD-10-CM | POA: Diagnosis not present

## 2018-07-13 DIAGNOSIS — N179 Acute kidney failure, unspecified: Secondary | ICD-10-CM | POA: Diagnosis not present

## 2018-07-13 DIAGNOSIS — F17200 Nicotine dependence, unspecified, uncomplicated: Secondary | ICD-10-CM | POA: Diagnosis not present

## 2018-07-13 DIAGNOSIS — R0602 Shortness of breath: Secondary | ICD-10-CM | POA: Diagnosis not present

## 2018-07-13 LAB — HEPATIC FUNCTION PANEL
ALK PHOS: 56 U/L (ref 38–126)
ALT: 16 U/L (ref 0–44)
AST: 21 U/L (ref 15–41)
Albumin: 3.3 g/dL — ABNORMAL LOW (ref 3.5–5.0)
BILIRUBIN INDIRECT: 0.6 mg/dL (ref 0.3–0.9)
BILIRUBIN TOTAL: 0.8 mg/dL (ref 0.3–1.2)
Bilirubin, Direct: 0.2 mg/dL (ref 0.0–0.2)
TOTAL PROTEIN: 6.3 g/dL — AB (ref 6.5–8.1)

## 2018-07-13 LAB — BASIC METABOLIC PANEL
Anion gap: 12 (ref 5–15)
BUN: 14 mg/dL (ref 8–23)
CHLORIDE: 100 mmol/L (ref 98–111)
CO2: 26 mmol/L (ref 22–32)
CREATININE: 1.42 mg/dL — AB (ref 0.61–1.24)
Calcium: 9.4 mg/dL (ref 8.9–10.3)
GFR calc Af Amer: 60 mL/min — ABNORMAL LOW (ref >60)
GFR calc non Af Amer: 51 mL/min — ABNORMAL LOW (ref >60)
GLUCOSE: 108 mg/dL — AB (ref 70–99)
Potassium: 4.2 mmol/L (ref 3.5–5.1)
Sodium: 138 mmol/L (ref 135–145)

## 2018-07-13 LAB — I-STAT TROPONIN, ED: Troponin i, poc: 0 ng/mL (ref 0.00–0.08)

## 2018-07-13 LAB — CBC
HEMATOCRIT: 41.7 % (ref 39.0–52.0)
HEMOGLOBIN: 12.9 g/dL — AB (ref 13.0–17.0)
MCH: 29.3 pg (ref 26.0–34.0)
MCHC: 30.9 g/dL (ref 30.0–36.0)
MCV: 94.6 fL (ref 80.0–100.0)
Platelets: 476 10*3/uL — ABNORMAL HIGH (ref 150–400)
RBC: 4.41 MIL/uL (ref 4.22–5.81)
RDW: 15.2 % (ref 11.5–15.5)
WBC: 7.1 10*3/uL (ref 4.0–10.5)
nRBC: 0 % (ref 0.0–0.2)

## 2018-07-13 LAB — RAPID URINE DRUG SCREEN, HOSP PERFORMED
Amphetamines: NOT DETECTED
BARBITURATES: NOT DETECTED
BENZODIAZEPINES: NOT DETECTED
Cocaine: NOT DETECTED
Opiates: NOT DETECTED
Tetrahydrocannabinol: NOT DETECTED

## 2018-07-13 LAB — CBG MONITORING, ED: Glucose-Capillary: 123 mg/dL — ABNORMAL HIGH (ref 70–99)

## 2018-07-13 LAB — BRAIN NATRIURETIC PEPTIDE: B Natriuretic Peptide: 9.7 pg/mL (ref 0.0–100.0)

## 2018-07-13 NOTE — ED Notes (Addendum)
Pt given urinal and is aware that a sample is needed.

## 2018-07-13 NOTE — ED Triage Notes (Addendum)
Patient c/o CP and SOB that started 3 hours ago. States that it is getting worse. Patient was given 324mg  asa and 3 nitros with some relief.

## 2018-07-13 NOTE — ED Notes (Signed)
[facility] [phone]

## 2018-07-13 NOTE — H&P (Signed)
PCP: Dr [place]   Chief Complaint:  Chest pain  HPI: This is a 67 y/o male who presents with c/o centrally located chest pain. More so on the left. He denies any radiation. He denies SOB but reports chronic dizziness that leads to occasional falls. He reports some heart palpitations. He denies any nausea or vomiting. He reports a mild fever and a cough which he states is chronic. He states he still has the chest tightness. He states at its worse the pain is 8/10, and currently the pain is 8/10. Patient was sleeping when I arrived in room. The patient has not h/o HTN, diabetes. He does have a h/o tobacco use, cocaine and alcohol use. He has been clean for the last 2-3 months. He is in rehab  History provided by the patient who is alert and oriented  Review of Systems:  The patient denies anorexia, fever, weight loss, chest pain, vision loss, decreased hearing, hoarseness, chest pain, syncope, dyspnea on exertion, peripheral edema, balance deficits, hemoptysis, abdominal pain, melena, hematochezia, severe indigestion/heartburn, hematuria, incontinence, genital sores, muscle weakness, suspicious skin lesions, transient blindness, difficulty walking, depression, unusual weight change, abnormal bleeding, enlarged lymph nodes, angioedema, and breast masses.  Past Medical History: Past Medical History:  Diagnosis Date  . Anxiety    Past Surgical History:  Procedure Laterality Date  . FINGER AMPUTATION      Medications: Prior to Admission medications   none    Allergies:   Allergies  Allergen Reactions  . Haldol [Haloperidol] Other (See Comments)    constipation    Social History:  reports that he has been smoking. He has never used smokeless tobacco. He reports current alcohol use. He reports current drug use.  Family History: HTN, CAD  Physical Exam: Vitals:   07/13/18 2230 07/13/18 2245 07/13/18 2300 07/13/18 2315  BP:      Pulse:  79 84 82  Resp: (!) 21 15 13 14   Temp:       SpO2:  99% 96% 96%  Weight:      Height:        General:  Alert and oriented times three, well developed and nourished, no acute distress Eyes: PERRLA, pink conjunctiva, no scleral icterus ENT: Moist oral mucosa, neck supple, no thyromegaly Lungs: clear to ascultation, no wheeze, no crackles, no use of accessory muscles Cardiovascular: regular rate and rhythm, no regurgitation, no gallops, no murmurs. No carotid bruits, no JVD. reproducible left sided chest wall pain Abdomen: soft, positive BS, non-tender, non-distended, no organomegaly, not an acute abdomen GU: not examined Neuro: CN II - XII grossly intact, sensation intact Musculoskeletal: strength 5/5 all extremities, no clubbing, cyanosis or edema Skin: no rash, no subcutaneous crepitation, no decubitus Psych: appropriate patient   Labs on Admission:  Recent Labs    07/13/18 2054  NA 138  K 4.2  CL 100  CO2 26  GLUCOSE 108*  BUN 14  CREATININE 1.42*  CALCIUM 9.4   Recent Labs    07/13/18 2054  AST 21  ALT 16  ALKPHOS 56  BILITOT 0.8  PROT 6.3*  ALBUMIN 3.3*   No results for input(s): LIPASE, AMYLASE in the last 72 hours. Recent Labs    07/13/18 2054  WBC 7.1  HGB 12.9*  HCT 41.7  MCV 94.6  PLT 476*   No results for input(s): CKTOTAL, CKMB, CKMBINDEX, TROPONINI in the last 72 hours. Invalid input(s): POCBNP No results for input(s): DDIMER in the last 72 hours. No results for  input(s): HGBA1C in the last 72 hours. No results for input(s): CHOL, HDL, LDLCALC, TRIG, CHOLHDL, LDLDIRECT in the last 72 hours. No results for input(s): TSH, T4TOTAL, T3FREE, THYROIDAB in the last 72 hours.  Invalid input(s): FREET3 No results for input(s): VITAMINB12, FOLATE, FERRITIN, TIBC, IRON, RETICCTPCT in the last 72 hours.  Micro Results: No results found for this or any previous visit (from the past 240 hour(s)).   Radiological Exams on Admission: Dg Chest 2 View  Result Date: 07/13/2018 CLINICAL DATA:  Chest  pain and shortness of breath starting 3 hours prior to arrival. EXAM: CHEST - 2 VIEW COMPARISON:  04/05/2007 FINDINGS: There is indistinct density along both hemidiaphragms, greater on the left than the right. The appearance tense to favor atelectasis, with pneumonia or aspiration pneumonitis a less likely differential diagnostic consideration. Heart size within normal limits. The rest of the lung appears clear. IMPRESSION: 1. Bibasilar bandlike opacities favoring atelectasis over pneumonia. Electronically Signed   By: Gaylyn Rong M.D.   On: 07/13/2018 21:48    Assessment/Plan Present on Admission: . Chest pain -bring in for 23 hour observation -ACS orderset initiated -nitro SL ordered -cycle cardiac enzymes -toradol 30mg  IV once ordered now -chest pain is reproducible, suspect costochondritis  Tobacco use -nicotine patch, duonebs PRN  Acute kidney injury -IVF hydration ordered. BMP in AM   Granvel Proudfoot 07/13/2018, 11:36 PM

## 2018-07-14 DIAGNOSIS — R079 Chest pain, unspecified: Secondary | ICD-10-CM

## 2018-07-14 DIAGNOSIS — R0789 Other chest pain: Secondary | ICD-10-CM | POA: Diagnosis not present

## 2018-07-14 LAB — CBC
HCT: 41.5 % (ref 39.0–52.0)
HCT: 41.5 % (ref 39.0–52.0)
HEMOGLOBIN: 12.9 g/dL — AB (ref 13.0–17.0)
Hemoglobin: 12.9 g/dL — ABNORMAL LOW (ref 13.0–17.0)
MCH: 29.3 pg (ref 26.0–34.0)
MCH: 29.4 pg (ref 26.0–34.0)
MCHC: 31.1 g/dL (ref 30.0–36.0)
MCHC: 31.1 g/dL (ref 30.0–36.0)
MCV: 94.1 fL (ref 80.0–100.0)
MCV: 94.5 fL (ref 80.0–100.0)
Platelets: 488 10*3/uL — ABNORMAL HIGH (ref 150–400)
Platelets: 466 10*3/uL — ABNORMAL HIGH (ref 150–400)
RBC: 4.41 MIL/uL (ref 4.22–5.81)
RBC: 4.39 MIL/uL (ref 4.22–5.81)
RDW: 15.1 % (ref 11.5–15.5)
RDW: 15 % (ref 11.5–15.5)
WBC: 7.1 10*3/uL (ref 4.0–10.5)
WBC: 7.7 10*3/uL (ref 4.0–10.5)
nRBC: 0 % (ref 0.0–0.2)
nRBC: 0 % (ref 0.0–0.2)

## 2018-07-14 LAB — BASIC METABOLIC PANEL
Anion gap: 12 (ref 5–15)
BUN: 18 mg/dL (ref 8–23)
CO2: 26 mmol/L (ref 22–32)
Calcium: 8.9 mg/dL (ref 8.9–10.3)
Chloride: 100 mmol/L (ref 98–111)
Creatinine, Ser: 1.47 mg/dL — ABNORMAL HIGH (ref 0.61–1.24)
GFR calc non Af Amer: 49 mL/min — ABNORMAL LOW (ref >60)
GFR, EST AFRICAN AMERICAN: 57 mL/min — AB (ref >60)
Glucose, Bld: 93 mg/dL (ref 70–99)
Potassium: 4.5 mmol/L (ref 3.5–5.1)
Sodium: 138 mmol/L (ref 135–145)

## 2018-07-14 LAB — HIV ANTIBODY (ROUTINE TESTING W REFLEX): HIV Screen 4th Generation wRfx: NONREACTIVE

## 2018-07-14 LAB — TROPONIN I
Troponin I: <0.03 ng/mL (ref <0.03)
Troponin I: <0.03 ng/mL (ref <0.03)

## 2018-07-14 LAB — LIPID PANEL
Cholesterol: 122 mg/dL (ref 0–200)
HDL: 39 mg/dL — ABNORMAL LOW (ref >40)
LDL Cholesterol: 76 mg/dL (ref 0–99)
TRIGLYCERIDES: 37 mg/dL (ref <150)
Total CHOL/HDL Ratio: 3.1 RATIO
VLDL: 7 mg/dL (ref 0–40)

## 2018-07-14 LAB — I-STAT TROPONIN, ED
TROPONIN I, POC: 0.01 ng/mL (ref 0.00–0.08)
Troponin i, poc: 0 ng/mL (ref 0.00–0.08)

## 2018-07-14 LAB — CREATININE, SERUM
Creatinine, Ser: 1.34 mg/dL — ABNORMAL HIGH (ref 0.61–1.24)
GFR calc Af Amer: >60 mL/min (ref >60)
GFR calc non Af Amer: 55 mL/min — ABNORMAL LOW (ref >60)

## 2018-07-14 MED ORDER — HEPARIN SODIUM (PORCINE) 5000 UNIT/ML IJ SOLN
5000.0000 [IU] | Freq: Three times a day (TID) | INTRAMUSCULAR | Status: DC
  Administered 2018-07-14 (×2): 5000 [IU] via SUBCUTANEOUS
  Filled 2018-07-14 (×2): qty 1

## 2018-07-14 MED ORDER — ONDANSETRON HCL 4 MG/2ML IJ SOLN: 4.0000 mg | Freq: Four times a day (QID) | INTRAMUSCULAR | Status: DC | PRN

## 2018-07-14 MED ORDER — NITROGLYCERIN 0.4 MG SL SUBL: 0.4000 mg | SUBLINGUAL_TABLET | SUBLINGUAL | Status: DC | PRN

## 2018-07-14 MED ORDER — KETOROLAC TROMETHAMINE 30 MG/ML IJ SOLN
30.0000 mg | Freq: Once | INTRAMUSCULAR | Status: AC
  Administered 2018-07-14: 30 mg via INTRAVENOUS
  Filled 2018-07-14: qty 1

## 2018-07-14 MED ORDER — ASPIRIN EC 81 MG PO TBEC: 81.0000 mg | DELAYED_RELEASE_TABLET | Freq: Every day | ORAL | Status: DC

## 2018-07-14 MED ORDER — IPRATROPIUM-ALBUTEROL 0.5-2.5 (3) MG/3ML IN SOLN: 3.0000 mL | RESPIRATORY_TRACT | Status: DC | PRN

## 2018-07-14 MED ORDER — SODIUM CHLORIDE 0.9 % IV BOLUS
500.0000 mL | Freq: Once | INTRAVENOUS | Status: AC
  Administered 2018-07-14: 500 mL via INTRAVENOUS

## 2018-07-14 MED ORDER — ACETAMINOPHEN 325 MG PO TABS: 650.0000 mg | ORAL_TABLET | ORAL | Status: DC | PRN

## 2018-07-14 MED ORDER — NICOTINE 14 MG/24HR TD PT24
14.0000 mg | MEDICATED_PATCH | Freq: Every day | TRANSDERMAL | Status: DC
  Filled 2018-07-14: qty 1

## 2018-07-14 MED ORDER — SODIUM CHLORIDE 0.9 % IV SOLN
INTRAVENOUS | Status: DC
  Administered 2018-07-14: 02:00:00 via INTRAVENOUS

## 2018-07-14 NOTE — ED Notes (Signed)
Pt understands that he is leaving AMA.  Signature pad not working.  Advised pt that if any concerns return to ED.  Pt verbalized understanding.  C/o no pain.  Dr. Sheenan returned text page, is aware pt is leaving.  

## 2018-07-14 NOTE — ED Notes (Signed)
MD currently at bedside.

## 2018-07-14 NOTE — Consult Note (Addendum)
Cardiology Consultation:   Patient ID: Chase Carter; [id number]; [birth year]   Admit date: 07/13/2018 Date of Consult: 07/14/2018  Primar y Care Provider: Patient, No Pcp Per Primary Cardiologist: New   Patient Profile:   Chase Carter is a 67 y.o. male with a hx of anxiety who is being seen today for the evaluation of chest pain at the request of Dr. Joneen Roachrosley.  History of Present Illness:   Chase Carter is a 67 year old male with a history stated above who [facility] with complaints of left-sided chest pain without radiation or associated symptoms. History obtained from chart review as the patient is refusing to share any information about the reasons for presentation. He states that "the information is in the chart" and "you can go read". He is also stating that he will be leaving and and does not wish to have further testing completed. He is refusing his physical exam with me today. Per chart review he was having chest pain without radiation or associated symptoms. Admitting MD suggests that the pain was reproducible on exam, concerning for costochondritis. He has no documented hx of CAD and does not wish to share his family history with me. He has been participating in alcohol and drug rehabilitation for tobacco, cocaine and alcohol abuse and has been sober for approximately 2-3 months.    In the ED, EKG with NSR and no acute ischemic changes.  Initial i-STAT troponin negative at 0.00 with repeat delta negative at <0.03.  Creatinine found to be elevated at 1.47, above baseline from last labs 08/2016 at 1.10.  CXR with bibasilar bandlike opacities favoring atelectasis over pneumonia.  Cardiology has been asked to evaluate.   Past Medical History:  Diagnosis Date  . Anxiety     Past Surgical History:  Procedure Laterality Date  . FINGER AMPUTATION       Prior to Admission medications   Not on File    Inpatient Medications: Scheduled Meds: . [START  ON 07/15/2018] aspirin EC  81 mg Oral Daily  . heparin  5,000 Units Subcutaneous Q8H  . nicotine  14 mg Transdermal Daily   Continuous Infusions: . sodium chloride 75 mL/hr at 07/14/18 0210   PRN Meds: acetaminophen, ipratropium-albuterol, nitroGLYCERIN, ondansetron (ZOFRAN) IV  Allergies:    Allergies  Allergen Reactions  . Haldol [Haloperidol] Other (See Comments)    constipation    Social History:   Social History   Socioeconomic History  . Marital status: Single    Spouse name: Not on file  . Number of children: Not on file  . Years of education: Not on file  . Highest education level: Not on file  Occupational History  . Not on file  Social Needs  . Financial resource strain: Not on file  . Food insecurity:    Worry: Not on file    Inability: Not on file  . Transportation needs:    Medical: Not on file    Non-medical: Not on file  Tobacco Use  . Smoking status: Current Every Day Smoker  . Smokeless tobacco: Never Used  Substance and Sexual Activity  . Alcohol use: Yes  . Drug use: Yes  . Sexual activity: Not on file  Lifestyle  . Physical activity:    Days per week: Not on file    Minutes per session: Not on file  . Stress: Not on file  Relationships  . Social connections:    Talks on phone: Not on file  Gets together: Not on file    Attends religious service: Not on file    Active member of club or organization: Not on file    Attends meetings of clubs or organizations: Not on file    Relationship status: Not on file  . Intimate partner violence:    Fear of current or ex partner: Not on file    Emotionally abused: Not on file    Physically abused: Not on file    Forced sexual activity: Not on file  Other Topics Concern  . Not on file  Social History Narrative  . Not on file    Family History:   No family history on file. Family Status:  No family status information on file.    ROS:  Please see the history of present illness.  All other  ROS reviewed and negative.     Physical Exam/Data:   Vitals:   07/14/18 0700 07/14/18 0830 07/14/18 0900 07/14/18 0930  BP: 113/72 133/79 129/79 (!) 141/83  Pulse: 70 68 82 85  Resp: 11 17 20 20   Temp:      SpO2: 98% 97% 99% 97%  Weight:      Height:        Intake/Output Summary (Last 24 hours) at 07/14/2018 1131 Last data filed at 07/14/2018 0051 Gross per 24 hour  Intake 499.98 ml  Output -  Net 499.98 ml   Filed Weights   07/13/18 2052  Weight: 70.3 kg   Body mass index is 20.45 kg/m.   Pt declined physical exam   EKG:  The EKG was personally reviewed and demonstrates: 07/14/18 NSR with no acute ischemic changes  Telemetry:  Telemetry was personally reviewed and demonstrates: 07/14/18 NSR   Relevant CV Studies:  ECHO: None   CATH: None   Laboratory Data:  Chemistry Recent Labs  Lab 07/13/18 2054 07/14/18 0125 07/14/18 0433  NA 138  --  138  K 4.2  --  4.5  CL 100  --  100  CO2 26  --  26  GLUCOSE 108*  --  93  BUN 14  --  18  CREATININE 1.42* 1.34* 1.47*  CALCIUM 9.4  --  8.9  GFRNONAA 51* 55* 49*  GFRAA 60* >60 57*  ANIONGAP 12  --  12    Total Protein  Date Value Ref Range Status  07/13/2018 6.3 (L) 6.5 - 8.1 g/dL Final   Albumin  Date Value Ref Range Status  07/13/2018 3.3 (L) 3.5 - 5.0 g/dL Final   AST  Date Value Ref Range Status  07/13/2018 21 15 - 41 U/L Final   ALT  Date Value Ref Range Status  07/13/2018 16 0 - 44 U/L Final   Alkaline Phosphatase  Date Value Ref Range Status  07/13/2018 56 38 - 126 U/L Final   Total Bilirubin  Date Value Ref Range Status  07/13/2018 0.8 0.3 - 1.2 mg/dL Final   Hematology Recent Labs  Lab 07/13/18 2054 07/14/18 0125 07/14/18 0433  WBC 7.1 7.1 7.7  RBC 4.41 4.41 4.39  HGB 12.9* 12.9* 12.9*  HCT 41.7 41.5 41.5  MCV 94.6 94.1 94.5  MCH 29.3 29.3 29.4  MCHC 30.9 31.1 31.1  RDW 15.2 15.1 15.0  PLT 476* 488* 466*   Cardiac Enzymes Recent Labs  Lab 07/14/18 0125 07/14/18 0730   TROPONINI <0.03 <0.03    Recent Labs  Lab 07/13/18 2127 07/14/18 0107 07/14/18 0438  TROPIPOC 0.00 0.01 0.00    BNP  Recent Labs  Lab 07/13/18 2054  BNP 9.7    DDimer No results for input(s): DDIMER in the last 168 hours. TSH: No results found for: TSH Lipids: Lab Results  Component Value Date   CHOL 122 07/14/2018   HDL 39 (L) 07/14/2018   LDLCALC 76 07/14/2018   TRIG 37 07/14/2018   CHOLHDL 3.1 07/14/2018   HgbA1c:No results found for: HGBA1C  Radiology/Studies:  Dg Chest 2 View  Result Date: 07/13/2018 CLINICAL DATA:  Chest pain and shortness of breath starting 3 hours prior to arrival. EXAM: CHEST - 2 VIEW COMPARISON:  04/05/2007 FINDINGS: There is indistinct density along both hemidiaphragms, greater on the left than the right. The appearance tense to favor atelectasis, with pneumonia or aspiration pneumonitis a less likely differential diagnostic consideration. Heart size within normal limits. The rest of the lung appears clear. IMPRESSION: 1. Bibasilar bandlike opacities favoring atelectasis over pneumonia. Electronically Signed   By: Gaylyn Rong M.D.   On: 07/13/2018 21:48   Assessment and Plan:   1.  Chest pain: -Pt presented with apparent chest pain, however the patient is refusing to provide information regarding his presenting symptoms today -He is persistent in stating that he wants to go home and is declining further evaluation, including not participating in my HPI questioning or physical exam.  -EKG unremarkable, no acute changes -Troponin, negative -Denies current chest pain. States "I had chest pain, then it left"  -Given the fact that he is unwilling to provide further information, I cannot determine if he is in need or is even willing to go forward with further workup if indicated -So far this has been negative -It appears that the patient is leaving AMA -Will recommend OP stress test for further workup. I will send a staff message to have this  set up and reach out to the patient with date and time.   2.  Tobacco use: -Cessation encouraged  3.  Acute kidney injury: -Creatinine is elevated at 1.47 today  -IVF initiated for dehydration   For questions or updates, please contact [facility] Please consult [email or web address] for contact info under Cardiology/STEMI.   SignedGeorgie Chard NP-C [facility] Pager: [phone] 07/14/2018 11:31 AM  ---------------------------------------------------------------------------------------------   History and all data above reviewed.  Patient examined.  I agree with the findings as above.  Chase Carter was seen in the ED with chest pain, but says he does not have any additional chest pain. He pulled out his IV and removed his telemetry and is leaving the room as I attempted to see him.   Neg trops to date. ECG unremarkable for ischemia.  Physical exam: Patient refused exam  All available labs, radiology testing, previous records reviewed. Agree with documented assessment and plan of my colleague as stated above with the following additions or changes:  Principal Problem:   Chest pain   Plan: would recommend stress testing as an outpatient since the patient is leaving AMA currently.   Length of Stay:  LOS: 0 days   Parke Poisson, MD [facility] 12:08 PM  07/14/2018

## 2018-07-14 NOTE — Discharge Summary (Signed)
Patient left AGAINST MEDICAL ADVICE.  On rounds this morning he said he was chest pain-free.  Cardiology had just been into see him.  He did not stay for prescriptions/instructions.

## 2018-07-14 NOTE — ED Notes (Signed)
Noreene LarssonJill, Cardiology NP, in w/pt.

## 2018-07-14 NOTE — ED Notes (Signed)
Pt placed in hospital bed

## 2018-07-17 ENCOUNTER — Other Ambulatory Visit: Payer: Self-pay | Admitting: Cardiology

## 2018-07-17 DIAGNOSIS — R0789 Other chest pain: Secondary | ICD-10-CM

## 2018-08-05 NOTE — ED Provider Notes (Signed)
[facility] EMERGENCY DEPARTMENT Provider Note   CSN: [id number] Arrival date & time: 07/13/18  2047     History   Chief Complaint Chief Complaint  Patient presents with  . Chest Pain    HPI Chase Carter is a 68 y.o. male.  Patient is a cigarette smoking history presents with chest pain or shortness of breath that started 3 hours ago.  Patient was given aspirin and nitros with minimal relief.  Patient denies exertional symptoms.  Patient has been clean from cocaine and alcohol abuse for 2 to 3 months.  No radiation.     Past Medical History:  Diagnosis Date  . Anxiety     Patient Active Problem List   Diagnosis Date Noted  . Chest pain 07/13/2018    Past Surgical History:  Procedure Laterality Date  . FINGER AMPUTATION          Home Medications    Prior to Admission medications   Not on File    Family History No family history on file.  Social History Social History   Tobacco Use  . Smoking status: Current Every Day Smoker  . Smokeless tobacco: Never Used  Substance Use Topics  . Alcohol use: Yes  . Drug use: Yes     Allergies   Haldol [haloperidol]   Review of Systems Review of Systems  Constitutional: Negative for chills and fever.  HENT: Negative for congestion.   Eyes: Negative for visual disturbance.  Respiratory: Positive for shortness of breath.   Cardiovascular: Positive for chest pain. Negative for leg swelling.  Gastrointestinal: Negative for abdominal pain and vomiting.  Genitourinary: Negative for dysuria and flank pain.  Musculoskeletal: Negative for back pain, neck pain and neck stiffness.  Skin: Negative for rash.  Neurological: Negative for light-headedness and headaches.     Physical Exam Updated Vital Signs BP (!) 141/83   Pulse 85   Temp 98.5 F (36.9 C)   Resp 20   Ht 6\' 1"  (1.854 m)   Wt 70.3 kg   SpO2 97%   BMI 20.45 kg/m   Physical Exam Vitals signs and nursing note reviewed.   Constitutional:      Appearance: He is well-developed.  HENT:     Head: Normocephalic and atraumatic.  Eyes:     General:        Right eye: No discharge.        Left eye: No discharge.     Conjunctiva/sclera: Conjunctivae normal.  Neck:     Musculoskeletal: Normal range of motion and neck supple.     Trachea: No tracheal deviation.  Cardiovascular:     Rate and Rhythm: Normal rate and regular rhythm.  Pulmonary:     Effort: Pulmonary effort is normal.     Breath sounds: Normal breath sounds.  Abdominal:     General: There is no distension.     Palpations: Abdomen is soft.     Tenderness: There is no abdominal tenderness. There is no guarding.  Musculoskeletal:     Right lower leg: No edema.     Left lower leg: No edema.  Skin:    General: Skin is warm.     Findings: No rash.  Neurological:     Mental Status: He is alert and oriented to person, place, and time.      ED Treatments / Results  Labs (all labs ordered are listed, but only abnormal results are displayed) Labs Reviewed  BASIC METABOLIC PANEL - Abnormal; Notable  for the following components:      Result Value   Glucose, Bld 108 (*)    Creatinine, Ser 1.42 (*)    GFR calc non Af Amer 51 (*)    GFR calc Af Amer 60 (*)    All other components within normal limits  CBC - Abnormal; Notable for the following components:   Hemoglobin 12.9 (*)    Platelets 476 (*)    All other components within normal limits  HEPATIC FUNCTION PANEL - Abnormal; Notable for the following components:   Total Protein 6.3 (*)    Albumin 3.3 (*)    All other components within normal limits  CBC - Abnormal; Notable for the following components:   Hemoglobin 12.9 (*)    Platelets 488 (*)    All other components within normal limits  CREATININE, SERUM - Abnormal; Notable for the following components:   Creatinine, Ser 1.34 (*)    GFR calc non Af Amer 55 (*)    All other components within normal limits  BASIC METABOLIC PANEL -  Abnormal; Notable for the following components:   Creatinine, Ser 1.47 (*)    GFR calc non Af Amer 49 (*)    GFR calc Af Amer 57 (*)    All other components within normal limits  LIPID PANEL - Abnormal; Notable for the following components:   HDL 39 (*)    All other components within normal limits  CBC - Abnormal; Notable for the following components:   Hemoglobin 12.9 (*)    Platelets 466 (*)    All other components within normal limits  CBG MONITORING, ED - Abnormal; Notable for the following components:   Glucose-Capillary 123 (*)    All other components within normal limits  BRAIN NATRIURETIC PEPTIDE  RAPID URINE DRUG SCREEN, HOSP PERFORMED  HIV ANTIBODY (ROUTINE TESTING W REFLEX)  TROPONIN I  TROPONIN I  I-STAT TROPONIN, ED  I-STAT TROPONIN, ED  I-STAT TROPONIN, ED    EKG EKG Interpretation  Date/Time:  Sunday July 14 2018 08:00:29 EST Ventricular Rate:  85 PR Interval:    QRS Duration: 111 QT Interval:  382 QTC Calculation: 455 R Axis:   -73 Text Interpretation:  Sinus rhythm Left anterior fascicular block Abnormal R-wave progression, late transition since last tracing no significant change Confirmed by Eber HongMiller, Brian ([id number]) on 07/14/2018 8:05:02 AM Also confirmed by Eber HongMiller, Brian ([id number]), editor Sheppard EvensSimpson, Miranda [phone])  on 07/14/2018 12:22:00 PM   Radiology No results found.  Procedures Procedures (including critical care time)  Medications Ordered in ED Medications  ketorolac (TORADOL) 30 MG/ML injection 30 mg (30 mg Intravenous Given 07/14/18 0020)  sodium chloride 0.9 % bolus 500 mL (0 mLs Intravenous Stopped 07/14/18 0051)     Initial Impression / Assessment and Plan / ED Course  I have reviewed the triage vital signs and the nursing notes.  Pertinent labs & imaging results that were available during my care of the patient were reviewed by me and considered in my medical decision making (see chart for details).    Patient presents with acute  chest pain with 2 risk factors for cardiac disease.  Patient denies risk factors of blood clots.  Plan for further cardiac work-up including troponins.  Initial troponin negative.  Plan for observation.EKG poor R wave progression.   The patients results and plan were reviewed and discussed.   Any x-rays performed were independently reviewed by myself.   Differential diagnosis were considered with the presenting HPI.  Medications  ketorolac (TORADOL) 30 MG/ML injection 30 mg (30 mg Intravenous Given 07/14/18 0020)  sodium chloride 0.9 % bolus 500 mL (0 mLs Intravenous Stopped 07/14/18 0051)    Vitals:   07/14/18 0700 07/14/18 0830 07/14/18 0900 07/14/18 0930  BP: 113/72 133/79 129/79 (!) 141/83  Pulse: 70 68 82 85  Resp: 11 17 20 20   Temp:      SpO2: 98% 97% 99% 97%  Weight:      Height:        Final diagnoses:  None  Acute chest pain  Admission/ observation were discussed with the admitting physician, patient and/or family and they are comfortable with the plan.    Final Clinical Impressions(s) / ED Diagnoses   Final diagnoses:  None    ED Discharge Orders    None       Blane OharaZavitz, Tarita Deshmukh, MD 08/05/18 [id number]

## 2018-12-17 DIAGNOSIS — Z01021 Encounter for examination of eyes and vision following failed vision screening with abnormal findings: Secondary | ICD-10-CM | POA: Diagnosis not present

## 2018-12-17 DIAGNOSIS — J45909 Unspecified asthma, uncomplicated: Secondary | ICD-10-CM | POA: Diagnosis not present

## 2018-12-17 DIAGNOSIS — R7303 Prediabetes: Secondary | ICD-10-CM | POA: Diagnosis not present

## 2018-12-17 DIAGNOSIS — E785 Hyperlipidemia, unspecified: Secondary | ICD-10-CM | POA: Diagnosis not present

## 2018-12-17 DIAGNOSIS — Z01118 Encounter for examination of ears and hearing with other abnormal findings: Secondary | ICD-10-CM | POA: Diagnosis not present

## 2018-12-17 DIAGNOSIS — Z5181 Encounter for therapeutic drug level monitoring: Secondary | ICD-10-CM | POA: Diagnosis not present

## 2018-12-17 DIAGNOSIS — Z131 Encounter for screening for diabetes mellitus: Secondary | ICD-10-CM | POA: Diagnosis not present

## 2018-12-17 DIAGNOSIS — I10 Essential (primary) hypertension: Secondary | ICD-10-CM | POA: Diagnosis not present

## 2018-12-17 DIAGNOSIS — Z1329 Encounter for screening for other suspected endocrine disorder: Secondary | ICD-10-CM | POA: Diagnosis not present

## 2018-12-17 DIAGNOSIS — R51 Headache: Secondary | ICD-10-CM | POA: Diagnosis not present

## 2018-12-17 DIAGNOSIS — Z720 Tobacco use: Secondary | ICD-10-CM | POA: Diagnosis not present

## 2018-12-17 DIAGNOSIS — Z1389 Encounter for screening for other disorder: Secondary | ICD-10-CM | POA: Diagnosis not present

## 2019-01-06 DIAGNOSIS — I10 Essential (primary) hypertension: Secondary | ICD-10-CM | POA: Diagnosis not present

## 2019-01-06 DIAGNOSIS — F99 Mental disorder, not otherwise specified: Secondary | ICD-10-CM | POA: Diagnosis not present

## 2019-01-06 DIAGNOSIS — R7303 Prediabetes: Secondary | ICD-10-CM | POA: Diagnosis not present

## 2019-01-06 DIAGNOSIS — R51 Headache: Secondary | ICD-10-CM | POA: Diagnosis not present

## 2019-01-06 DIAGNOSIS — F5105 Insomnia due to other mental disorder: Secondary | ICD-10-CM | POA: Diagnosis not present

## 2019-01-06 DIAGNOSIS — E785 Hyperlipidemia, unspecified: Secondary | ICD-10-CM | POA: Diagnosis not present

## 2019-01-06 DIAGNOSIS — J45909 Unspecified asthma, uncomplicated: Secondary | ICD-10-CM | POA: Diagnosis not present

## 2019-01-06 DIAGNOSIS — Z720 Tobacco use: Secondary | ICD-10-CM | POA: Diagnosis not present

## 2019-02-05 ENCOUNTER — Encounter (HOSPITAL_COMMUNITY): Payer: Self-pay | Admitting: Family Medicine

## 2019-02-05 ENCOUNTER — Emergency Department (HOSPITAL_COMMUNITY)
Admission: EM | Admit: 2019-02-05 | Discharge: 2019-02-05 | Disposition: A | Discharge status: Home / Self Care | Payer: Medicare Other | Attending: Emergency Medicine | Admitting: Emergency Medicine

## 2019-02-05 ENCOUNTER — Emergency Department (HOSPITAL_COMMUNITY): Payer: Medicare Other

## 2019-02-05 ENCOUNTER — Other Ambulatory Visit: Payer: Self-pay

## 2019-02-05 DIAGNOSIS — M2578 Osteophyte, vertebrae: Secondary | ICD-10-CM | POA: Diagnosis not present

## 2019-02-05 DIAGNOSIS — I6782 Cerebral ischemia: Secondary | ICD-10-CM | POA: Diagnosis not present

## 2019-02-05 DIAGNOSIS — Z79899 Other long term (current) drug therapy: Secondary | ICD-10-CM | POA: Diagnosis not present

## 2019-02-05 DIAGNOSIS — R200 Anesthesia of skin: Secondary | ICD-10-CM | POA: Diagnosis not present

## 2019-02-05 DIAGNOSIS — G9589 Other specified diseases of spinal cord: Secondary | ICD-10-CM | POA: Diagnosis not present

## 2019-02-05 DIAGNOSIS — G5631 Lesion of radial nerve, right upper limb: Secondary | ICD-10-CM

## 2019-02-05 DIAGNOSIS — R69 Illness, unspecified: Secondary | ICD-10-CM | POA: Diagnosis not present

## 2019-02-05 DIAGNOSIS — M50223 Other cervical disc displacement at C6-C7 level: Secondary | ICD-10-CM | POA: Diagnosis not present

## 2019-02-05 DIAGNOSIS — W19XXXA Unspecified fall, initial encounter: Secondary | ICD-10-CM | POA: Diagnosis not present

## 2019-02-05 DIAGNOSIS — M5001 Cervical disc disorder with myelopathy,  high cervical region: Secondary | ICD-10-CM | POA: Diagnosis not present

## 2019-02-05 DIAGNOSIS — M4802 Spinal stenosis, cervical region: Secondary | ICD-10-CM | POA: Insufficient documentation

## 2019-02-05 DIAGNOSIS — M1712 Unilateral primary osteoarthritis, left knee: Secondary | ICD-10-CM | POA: Diagnosis not present

## 2019-02-05 DIAGNOSIS — M1711 Unilateral primary osteoarthritis, right knee: Secondary | ICD-10-CM | POA: Diagnosis not present

## 2019-02-05 DIAGNOSIS — F17200 Nicotine dependence, unspecified, uncomplicated: Secondary | ICD-10-CM | POA: Diagnosis not present

## 2019-02-05 DIAGNOSIS — R5381 Other malaise: Secondary | ICD-10-CM | POA: Diagnosis not present

## 2019-02-05 LAB — BASIC METABOLIC PANEL
Anion gap: 10 (ref 5–15)
BUN: 12 mg/dL (ref 8–23)
CO2: 26 mmol/L (ref 22–32)
Calcium: 9.5 mg/dL (ref 8.9–10.3)
Chloride: 102 mmol/L (ref 98–111)
Creatinine, Ser: 1.02 mg/dL (ref 0.61–1.24)
GFR calc Af Amer: >60 mL/min (ref >60)
GFR calc non Af Amer: >60 mL/min (ref >60)
Glucose, Bld: 88 mg/dL (ref 70–99)
Potassium: 4 mmol/L (ref 3.5–5.1)
Sodium: 138 mmol/L (ref 135–145)

## 2019-02-05 LAB — CBC WITH DIFFERENTIAL/PLATELET
Abs Immature Granulocytes: 0.01 10*3/uL (ref 0.00–0.07)
Basophils Absolute: 0.1 10*3/uL (ref 0.0–0.1)
Basophils Relative: 2 %
Eosinophils Absolute: 0.4 10*3/uL (ref 0.0–0.5)
Eosinophils Relative: 6 %
HCT: 49.6 % (ref 39.0–52.0)
Hemoglobin: 15.6 g/dL (ref 13.0–17.0)
Immature Granulocytes: 0 %
Lymphocytes Relative: 27 %
Lymphs Abs: 1.8 10*3/uL (ref 0.7–4.0)
MCH: 28.8 pg (ref 26.0–34.0)
MCHC: 31.5 g/dL (ref 30.0–36.0)
MCV: 91.7 fL (ref 80.0–100.0)
Monocytes Absolute: 0.5 10*3/uL (ref 0.1–1.0)
Monocytes Relative: 7 %
Neutro Abs: 3.9 10*3/uL (ref 1.7–7.7)
Neutrophils Relative %: 58 %
Platelets: 392 10*3/uL (ref 150–400)
RBC: 5.41 MIL/uL (ref 4.22–5.81)
RDW: 14.7 % (ref 11.5–15.5)
WBC: 6.7 10*3/uL (ref 4.0–10.5)
nRBC: 0 % (ref 0.0–0.2)

## 2019-02-05 LAB — MAGNESIUM: Magnesium: 2 mg/dL (ref 1.7–2.4)

## 2019-02-05 MED ORDER — ASPIRIN EC 325 MG PO TBEC
325.0000 mg | DELAYED_RELEASE_TABLET | Freq: Once | ORAL | Status: AC
  Administered 2019-02-05: 325 mg via ORAL
  Filled 2019-02-05: qty 1

## 2019-02-05 MED ORDER — PREDNISONE 10 MG PO TABS: ORAL_TABLET | ORAL | 0 refills | Status: AC

## 2019-02-05 NOTE — ED Triage Notes (Signed)
Patient is from home and transported via [facility]. Patient states around 15:00 yesterday he woke from a nap on the back porch when he noticed had right hand numbness.

## 2019-02-05 NOTE — ED Notes (Signed)
Pt ambulatory without assistance at discharge.   

## 2019-02-05 NOTE — ED Notes (Addendum)
Tele-neuro examination completed

## 2019-02-05 NOTE — ED Notes (Signed)
Patient transported to MRI 

## 2019-02-05 NOTE — ED Provider Notes (Signed)
9:20 AM -the patient was seen earlier today by Dr. Kathrynn Humble.  He was concerned that the patient had right wrist drop with numbness, complicating a clinical diagnosis.  Therefore he asked that tele-neurology evaluate the patient.  I talked to the tele-neurologist who saw the patient and was concerned that the patient mainly dysarthric, however he felt that there were limitations with the telemetry communication.  I recommended treating the patient with aspirin, and proceeding with advanced imaging, MRI brain and cervical spine to evaluate for a central process.  I have ordered these tests.  1:30 PM-I discussed the MRI results with Dr. Nevada Crane, neurology.  He states that he feels that the neurologic disability of the right hand he is related to chronic cervical spinal stenosis.  Patient has advanced cervical spine degeneration with multilevel spinal stenosis and spinal cord mass-effect.  This abnormality is most pronounced at C4-5, and he has apparent right hemicord myelomalacia.  He also has notable foraminal stenosis.  At this time the patient is alert, conversant.  He has dysarthria.  He is lucid.  He states he lives in a recovery house, where he is getting ongoing management for alcohol and substance abuse.  He states he has had similar problems with the right hand before.  He has never had it evaluated or treated formally.  At this time he is able to move both arms and legs equally.  The right hand however has notable for a wrist drop, and decreased sensation in all of his fingers.  He has previous partial amputations to right fingers 3, 4, 5.  Callback requested from neurosurgery-I discussed case with Dr. Ronnald Ramp, at 2:25 PM.  He agrees with outpatient follow-up.  Prescription sent to the patient's pharmacy of choice.   Daleen Bo, MD 02/05/19 1425

## 2019-02-05 NOTE — ED Notes (Signed)
Patient transported to X-ray 

## 2019-02-05 NOTE — Consult Note (Signed)
[facility] TeleNeurology Consult Services  Stat Consult  Date of Service:   02/05/2019 08:25:51  Impression:     .  Rule Out Acute Ischemic Stroke     .  Rule out wrist drop 2/2 radial nerve palsy, limited exam  Comments/Sign-Out: 68 yo M with acute wrist drop and paresthesia of the right hand. He also has paresthesia of left leg and moderate dysarthria but unclear if this is the way he always speaks.   Regardless there is concern for stroke so rule out with MRI brain as well as MRI C spine. Obtain full stroke workup on admission as well. Aspirn 81 for stroke ppx  CT HEAD: Showed No Acute Hemorrhage or Acute Core Infarct  Metrics: [facility] Notification Time: 02/05/2019 08:23:37 Stamp Time: 02/05/2019 08:25:51 Callback Response Time: 02/05/2019 08:33:54 Video Start Time: 02/05/2019 08:50:56 Video End Time: 02/05/2019 08:59:45  Our recommendations are outlined below.  Recommendations:     .  Antiplatelet Therapy   Therapies:     .  Physical Therapy, Occupational Therapy, Speech Therapy Assessment When Applicable  Other WorkUp:     .  Check CMP     .  Check B12 level     .  Check TSH  Disposition: Neurology Follow Up Recommended  Sign Out:     .  Discussed with Emergency Department Provider  ----------------------------------------------------------------------------------------------------  Chief Complaint: wrist drop  History of Present Illness: Patient is a 68 year old Male.  68 yo M with right wrist drop and numbness since falling asleep on the chair yesterday afternoon. It spares his right arm forearm and face. He is also dysarthric so difficulty to understadxam limited however  Examination: BP(128/95),  Neuro Exam:  General: Alert,Awake  Speech: Dysarthric:  Language: Intact:  Face: Symmetric:  Facial Sensation: Intact:  Visual Fields: Intact:  Extraocular Movements: Intact:  Motor  Exam:  Sensation:  Coordination:  RUE with wrist drop unable to extend fingers, oppose thumb. No other focal weakness Sensation decreased in right hand and left leg  Patient/Family was informed the Neurology Consult would happen via TeleHealth consult by way of interactive audio and video telecommunications and consented to receiving care in this manner.  Due to the immediate potential for life-threatening deterioration due to underlying acute neurologic illness, I spent 35 minutes providing critical care. This time includes time for face to face visit via telemedicine, review of medical records, imaging studies and discussion of findings with providers, the patient and/or family.   Dr Leda Quail   [facility] [phone]   Case [id number]

## 2019-02-05 NOTE — Progress Notes (Signed)
CSW was consulted for this patient. Upon speaking to Dr. Kathrynn Humble, he reports this consult was put submitted in error and patient has no identified social work or case management needs. CSW signing off, please reconsult if any needs arise.   Golden Circle, LCSW Transitions of Care Department [facility] ED [phone]

## 2019-02-05 NOTE — Discharge Instructions (Addendum)
Try to avoid putting pressure on the right arm, when you sleep.  You can try using some heat on your neck, in the form of a heating pad 2 or 3 times a day.  Call the neurosurgeon listed on this instruction form, for an appointment to be seen for further evaluation and treatment

## 2019-02-12 DIAGNOSIS — E785 Hyperlipidemia, unspecified: Secondary | ICD-10-CM | POA: Diagnosis not present

## 2019-02-12 DIAGNOSIS — R7303 Prediabetes: Secondary | ICD-10-CM | POA: Diagnosis not present

## 2019-02-12 DIAGNOSIS — Z720 Tobacco use: Secondary | ICD-10-CM | POA: Diagnosis not present

## 2019-02-12 DIAGNOSIS — F5105 Insomnia due to other mental disorder: Secondary | ICD-10-CM | POA: Diagnosis not present

## 2019-02-12 DIAGNOSIS — I10 Essential (primary) hypertension: Secondary | ICD-10-CM | POA: Diagnosis not present

## 2019-02-12 DIAGNOSIS — J45909 Unspecified asthma, uncomplicated: Secondary | ICD-10-CM | POA: Diagnosis not present

## 2019-02-12 DIAGNOSIS — F99 Mental disorder, not otherwise specified: Secondary | ICD-10-CM | POA: Diagnosis not present

## 2019-02-12 DIAGNOSIS — R51 Headache: Secondary | ICD-10-CM | POA: Diagnosis not present

## 2019-02-12 NOTE — ED Provider Notes (Signed)
[facility] DEPT Provider Note   CSN: [id number] Arrival date & time: 02/05/19  0117     History   Chief Complaint Chief Complaint  Patient presents with  . hand numbness    HPI Chase Carter is a 68 y.o. male.     HPI  68 y/o males comes in with cc of R sided numbness and weakness to the hand. Pt states that he woke up after a long nap with weakness and numbness to the R hand. Pt was last normal y'day afternoon. He is R handed and has no hx of stroke. Pt denies any weakness or numbness proximal to the distal forearm.  Past Medical History:  Diagnosis Date  . Anxiety     Patient Active Problem List   Diagnosis Date Noted  . Chest pain 07/13/2018    Past Surgical History:  Procedure Laterality Date  . FINGER AMPUTATION          Home Medications    Prior to Admission medications   Medication Sig Start Date End Date Taking? Authorizing Provider  lisinopril-hydrochlorothiazide (ZESTORETIC) 20-25 MG tablet Take 1 tablet by mouth daily. 01/06/19  Yes [provider]  meloxicam (MOBIC) 7.5 MG tablet Take 7.5 mg by mouth 2 (two) times daily as needed. 01/06/19  Yes [provider]  VENTOLIN HFA 108 (90 Base) MCG/ACT inhaler Inhale 2 puffs into the lungs every 6 (six) hours as needed.  01/06/19  Yes [provider]  predniSONE (DELTASONE) 10 MG tablet Take q day 6,5,4,3,2,1 02/05/19   Daleen Bo, MD    Family History History reviewed. No pertinent family history.  Social History Social History   Tobacco Use  . Smoking status: Current Every Day Smoker  . Smokeless tobacco: Never Used  Substance Use Topics  . Alcohol use: Yes  . Drug use: Yes     Allergies   Haldol [haloperidol]   Review of Systems Review of Systems  Constitutional: Positive for activity change.  Neurological: Positive for weakness and numbness.  All other systems reviewed and are negative.    Physical Exam Updated Vital Signs  BP (!) 135/100   Pulse 69   Temp 97.9 F (36.6 C) (Oral)   Resp 16   Ht 6\' 1"  (1.854 m)   Wt 71.2 kg   SpO2 100%   BMI 20.71 kg/m   Physical Exam Vitals signs and nursing note reviewed.  Constitutional:      Appearance: He is well-developed.  HENT:     Head: Normocephalic and atraumatic.  Eyes:     Conjunctiva/sclera: Conjunctivae normal.     Pupils: Pupils are equal, round, and reactive to light.  Neck:     Musculoskeletal: Normal range of motion and neck supple.  Cardiovascular:     Rate and Rhythm: Normal rate and regular rhythm.  Pulmonary:     Effort: Pulmonary effort is normal.     Breath sounds: Normal breath sounds.  Abdominal:     General: Bowel sounds are normal. There is no distension.     Palpations: Abdomen is soft.     Tenderness: There is no abdominal tenderness. There is no guarding or rebound.  Skin:    General: Skin is warm.  Neurological:     Mental Status: He is alert and oriented to person, place, and time.     Sensory: Sensory deficit present.     Motor: Weakness present.      ED Treatments / Results  Labs (all labs  ordered are listed, but only abnormal results are displayed) Labs Reviewed  BASIC METABOLIC PANEL  CBC WITH DIFFERENTIAL/PLATELET  MAGNESIUM    EKG None  Radiology No results found.  Procedures Procedures (including critical care time)  Medications Ordered in ED Medications  aspirin EC tablet 325 mg (325 mg Oral Given 02/05/19 1104)     Initial Impression / Assessment and Plan / ED Course  I have reviewed the triage vital signs and the nursing notes.  Pertinent labs & imaging results that were available during my care of the patient were reviewed by me and considered in my medical decision making (see chart for details).        Pt has acute wrist drop and numbness. No hx of strokes. Given that there is both sensory and motor dysfunction involving the entire hand, acute stroke is in the ddx. Initial gestalt is  that pt likely has radiculopathy leading to nerve palsy. No cspine tenderness on exam. Tele-neuro consulted. Dr. Effie ShyWentz to f/u.  Final Clinical Impressions(s) / ED Diagnoses   Final diagnoses:  Radial nerve palsy, right  Cervical spinal stenosis  Cervical cord myelomalacia [facility](HCC)    ED Discharge Orders         Ordered    predniSONE (DELTASONE) 10 MG tablet     02/05/19 1429           Derwood KaplanNanavati, Mariah Gerstenberger, MD 02/12/19 1916

## 2019-02-13 DIAGNOSIS — M21331 Wrist drop, right wrist: Secondary | ICD-10-CM | POA: Diagnosis not present

## 2019-02-13 DIAGNOSIS — I10 Essential (primary) hypertension: Secondary | ICD-10-CM | POA: Diagnosis not present

## 2019-02-19 ENCOUNTER — Emergency Department (HOSPITAL_COMMUNITY)
Admission: EM | Admit: 2019-02-19 | Discharge: 2019-02-20 | Disposition: A | Discharge status: Home / Self Care | Payer: Medicare Other | Attending: Emergency Medicine | Admitting: Emergency Medicine

## 2019-02-19 ENCOUNTER — Other Ambulatory Visit: Payer: Self-pay

## 2019-02-19 ENCOUNTER — Encounter (HOSPITAL_COMMUNITY): Payer: Self-pay

## 2019-02-19 DIAGNOSIS — Z5321 Procedure and treatment not carried out due to patient leaving prior to being seen by health care provider: Secondary | ICD-10-CM | POA: Insufficient documentation

## 2019-02-19 DIAGNOSIS — R531 Weakness: Secondary | ICD-10-CM | POA: Diagnosis not present

## 2019-02-19 DIAGNOSIS — M79601 Pain in right arm: Secondary | ICD-10-CM | POA: Diagnosis not present

## 2019-02-19 NOTE — ED Triage Notes (Addendum)
Pt complaining of chronic R arm pain. States if we don't give him pain meds, he is going to [facility].

## 2019-02-22 ENCOUNTER — Encounter (HOSPITAL_COMMUNITY): Payer: Self-pay | Admitting: Emergency Medicine

## 2019-02-22 ENCOUNTER — Emergency Department (HOSPITAL_COMMUNITY)
Admission: EM | Admit: 2019-02-22 | Discharge: 2019-02-22 | Discharge status: Against Medical Advice | Payer: Medicare Other | Attending: Emergency Medicine | Admitting: Emergency Medicine

## 2019-02-22 ENCOUNTER — Other Ambulatory Visit: Payer: Self-pay

## 2019-02-22 DIAGNOSIS — R52 Pain, unspecified: Secondary | ICD-10-CM | POA: Diagnosis not present

## 2019-02-22 DIAGNOSIS — M79603 Pain in arm, unspecified: Secondary | ICD-10-CM | POA: Diagnosis not present

## 2019-02-22 DIAGNOSIS — Z5321 Procedure and treatment not carried out due to patient leaving prior to being seen by health care provider: Secondary | ICD-10-CM | POA: Diagnosis not present

## 2019-02-22 DIAGNOSIS — M79601 Pain in right arm: Secondary | ICD-10-CM | POA: Diagnosis present

## 2019-02-22 DIAGNOSIS — M542 Cervicalgia: Secondary | ICD-10-CM | POA: Diagnosis not present

## 2019-02-22 NOTE — ED Triage Notes (Signed)
Patient is complaining of chronic right arm pain. Patient states that he wants help. Patient was here on 02/19/19. Patient is rambling. Patient has a hx of bipolar.

## 2019-02-22 NOTE — ED Notes (Signed)
Patient walked to RN station and stated he was leaving. RN tried to redirect pt into his room and ensured his provider would be in momentarily, pt continued to walk towards triage and exited the ED. Provider made aware.

## 2019-02-25 ENCOUNTER — Emergency Department (HOSPITAL_COMMUNITY)
Admission: EM | Admit: 2019-02-25 | Discharge: 2019-02-25 | Disposition: A | Discharge status: Home / Self Care | Payer: Medicare Other | Attending: Emergency Medicine | Admitting: Emergency Medicine

## 2019-02-25 ENCOUNTER — Encounter (HOSPITAL_COMMUNITY): Payer: Self-pay

## 2019-02-25 ENCOUNTER — Other Ambulatory Visit: Payer: Self-pay

## 2019-02-25 DIAGNOSIS — Z5321 Procedure and treatment not carried out due to patient leaving prior to being seen by health care provider: Secondary | ICD-10-CM | POA: Insufficient documentation

## 2019-02-25 DIAGNOSIS — R52 Pain, unspecified: Secondary | ICD-10-CM | POA: Diagnosis not present

## 2019-02-25 DIAGNOSIS — M62838 Other muscle spasm: Secondary | ICD-10-CM | POA: Insufficient documentation

## 2019-02-25 NOTE — ED Triage Notes (Addendum)
Per ems: pt coming from home c/o chronic right arm pain with neck cramping. Pt stated to ems "I dont know what is fucking wrong. [facility] has my records. Just take me to the fucking hospital" Pt talking in incomplete sentences.

## 2019-02-25 NOTE — ED Notes (Signed)
Pt said that he was leaving. Staff watched pt walk out of the lobby

## 2019-03-02 ENCOUNTER — Emergency Department (HOSPITAL_COMMUNITY)
Admission: EM | Admit: 2019-03-02 | Discharge: 2019-03-02 | Disposition: A | Discharge status: Home / Self Care | Payer: 59 | Attending: Emergency Medicine | Admitting: Emergency Medicine

## 2019-03-02 ENCOUNTER — Encounter (HOSPITAL_COMMUNITY): Payer: Self-pay

## 2019-03-02 ENCOUNTER — Other Ambulatory Visit: Payer: Self-pay

## 2019-03-02 DIAGNOSIS — M4802 Spinal stenosis, cervical region: Secondary | ICD-10-CM | POA: Insufficient documentation

## 2019-03-02 DIAGNOSIS — M79601 Pain in right arm: Secondary | ICD-10-CM | POA: Diagnosis present

## 2019-03-02 DIAGNOSIS — G589 Mononeuropathy, unspecified: Secondary | ICD-10-CM | POA: Insufficient documentation

## 2019-03-02 DIAGNOSIS — Z79899 Other long term (current) drug therapy: Secondary | ICD-10-CM | POA: Diagnosis not present

## 2019-03-02 DIAGNOSIS — M25519 Pain in unspecified shoulder: Secondary | ICD-10-CM | POA: Diagnosis not present

## 2019-03-02 DIAGNOSIS — F17200 Nicotine dependence, unspecified, uncomplicated: Secondary | ICD-10-CM | POA: Diagnosis not present

## 2019-03-02 NOTE — ED Triage Notes (Signed)
Pt c/o right arm pain. Denies trauma/injury. Pt states pain has been his whole life.

## 2019-03-02 NOTE — ED Notes (Signed)
Pt acting angry but unable to find out what he is angry about.

## 2019-03-02 NOTE — Discharge Instructions (Signed)
Expect a call from Neurosurgery for a follow up appointment.

## 2019-03-02 NOTE — ED Notes (Signed)
Pt cursing when leaving and stormed out.

## 2019-03-02 NOTE — ED Provider Notes (Signed)
[facility] Provider Note   CSN: [id number] Arrival date & time: 03/02/19  1039     History   Chief Complaint Chief Complaint  Patient presents with  . Arm Pain    HPI Kieth BrightlyRoosevelt Holifield is a 68 y.o. male.     HPI  68 year old male with history of anxiety and recent diagnosis of peripheral nerve palsy affecting the right upper extremity because of cervical spine disease comes into the ER seeking further help He complains of discomfort in his right arm.  He states that he is unable to use his right upper extremity properly.  He was advised to follow-up with neurosurgery, but states that every time he is called neurosurgery he has had difficulty getting an appointment and does not have an appointment at this time.  Symptoms are not new, and have been present since at least the last month when he was diagnosed with it in the ED.  Past Medical History:  Diagnosis Date  . Anxiety     Patient Active Problem List   Diagnosis Date Noted  . Chest pain 07/13/2018    Past Surgical History:  Procedure Laterality Date  . FINGER AMPUTATION          Home Medications    Prior to Admission medications   Medication Sig Start Date End Date Taking? Authorizing Provider  lisinopril-hydrochlorothiazide (ZESTORETIC) 20-25 MG tablet Take 1 tablet by mouth daily. 01/06/19   [provider]  meloxicam (MOBIC) 7.5 MG tablet Take 7.5 mg by mouth 2 (two) times daily as needed. 01/06/19   [provider]  predniSONE (DELTASONE) 10 MG tablet Take q day 6,5,4,3,2,1 Patient not taking: Reported on 02/22/2019 02/05/19   Mancel BaleWentz, Elliott, MD  VENTOLIN HFA 108 [phone](90 Base) MCG/ACT inhaler Inhale 2 puffs into the lungs every 6 (six) hours as needed for wheezing.  01/06/19   [provider]    Family History No family history on file.  Social History Social History   Tobacco Use  . Smoking status: Current Every Day Smoker  . Smokeless tobacco:  Never Used  Substance Use Topics  . Alcohol use: Yes  . Drug use: Yes     Allergies   Haldol [haloperidol]   Review of Systems Review of Systems  Constitutional: Positive for activity change.  Neurological: Positive for weakness.     Physical Exam Updated Vital Signs BP 113/88 (BP Location: Left Arm)   Pulse 83   Temp 98.6 F (37 C) (Oral)   Resp 16   Ht 6\' 1"  (1.854 m)   Wt 68 kg   SpO2 99%   BMI 19.79 kg/m   Physical Exam Vitals signs and nursing note reviewed.  Constitutional:      Appearance: He is well-developed.  HENT:     Head: Atraumatic.  Neck:     Musculoskeletal: Neck supple.  Cardiovascular:     Rate and Rhythm: Normal rate.  Pulmonary:     Effort: Pulmonary effort is normal.  Skin:    General: Skin is warm.  Neurological:     Mental Status: He is alert and oriented to person, place, and time.     Sensory: Sensory deficit present.     Motor: Weakness present.      ED Treatments / Results  Labs (all labs ordered are listed, but only abnormal results are displayed) Labs Reviewed - No data to display  EKG None  Radiology No results found.  Procedures Procedures (including critical care time)  Medications Ordered in ED Medications - No data to display   Initial Impression / Assessment and Plan / ED Course  I have reviewed the triage vital signs and the nursing notes.  Pertinent labs & imaging results that were available during my care of the patient were reviewed by me and considered in my medical decision making (see chart for details).        68 year old comes in a chief complaint of right arm pain.  He was diagnosed with severe radiculopathy leading to nerve palsy a month ago.  He has not followed up with neurosurgery despite the recommendation.  He states that he has not been able to get appointment.  Patient has anxiety and perhaps some cognitive delay.  I have sent an email to the neurosurgical team to see if they can  help patient get an appointment.  Final Clinical Impressions(s) / ED Diagnoses   Final diagnoses:  Cervical stenosis of spinal canal  Nerve palsy    ED Discharge Orders    None       Varney Biles, MD 03/02/19 1439

## 2019-03-03 DIAGNOSIS — R51 Headache: Secondary | ICD-10-CM | POA: Diagnosis not present

## 2019-03-03 DIAGNOSIS — E785 Hyperlipidemia, unspecified: Secondary | ICD-10-CM | POA: Diagnosis not present

## 2019-03-03 DIAGNOSIS — I10 Essential (primary) hypertension: Secondary | ICD-10-CM | POA: Diagnosis not present

## 2019-03-03 DIAGNOSIS — Z720 Tobacco use: Secondary | ICD-10-CM | POA: Diagnosis not present

## 2019-03-03 DIAGNOSIS — R7303 Prediabetes: Secondary | ICD-10-CM | POA: Diagnosis not present

## 2019-03-19 DIAGNOSIS — Z131 Encounter for screening for diabetes mellitus: Secondary | ICD-10-CM | POA: Diagnosis not present

## 2019-03-19 DIAGNOSIS — R7303 Prediabetes: Secondary | ICD-10-CM | POA: Diagnosis not present

## 2019-03-19 DIAGNOSIS — I10 Essential (primary) hypertension: Secondary | ICD-10-CM | POA: Diagnosis not present

## 2019-03-19 DIAGNOSIS — Z0000 Encounter for general adult medical examination without abnormal findings: Secondary | ICD-10-CM | POA: Diagnosis not present

## 2019-04-11 DIAGNOSIS — I10 Essential (primary) hypertension: Secondary | ICD-10-CM | POA: Diagnosis not present

## 2019-04-11 DIAGNOSIS — M79641 Pain in right hand: Secondary | ICD-10-CM | POA: Diagnosis not present

## 2019-04-11 DIAGNOSIS — Z5320 Procedure and treatment not carried out because of patient's decision for unspecified reasons: Secondary | ICD-10-CM | POA: Diagnosis not present

## 2019-04-11 DIAGNOSIS — M79603 Pain in arm, unspecified: Secondary | ICD-10-CM | POA: Diagnosis not present

## 2019-04-11 DIAGNOSIS — R52 Pain, unspecified: Secondary | ICD-10-CM | POA: Diagnosis not present

## 2019-04-29 DIAGNOSIS — Z03818 Encounter for observation for suspected exposure to other biological agents ruled out: Secondary | ICD-10-CM | POA: Diagnosis not present

## 2019-04-29 DIAGNOSIS — Z590 Homelessness: Secondary | ICD-10-CM | POA: Diagnosis not present

## 2019-04-29 DIAGNOSIS — R410 Disorientation, unspecified: Secondary | ICD-10-CM | POA: Diagnosis not present

## 2019-05-28 DIAGNOSIS — Z888 Allergy status to other drugs, medicaments and biological substances status: Secondary | ICD-10-CM | POA: Diagnosis not present

## 2019-05-28 DIAGNOSIS — M542 Cervicalgia: Secondary | ICD-10-CM | POA: Diagnosis not present

## 2019-05-28 DIAGNOSIS — Z720 Tobacco use: Secondary | ICD-10-CM | POA: Diagnosis not present

## 2019-05-28 DIAGNOSIS — R52 Pain, unspecified: Secondary | ICD-10-CM | POA: Diagnosis not present

## 2019-06-01 DIAGNOSIS — R404 Transient alteration of awareness: Secondary | ICD-10-CM | POA: Diagnosis not present

## 2019-06-01 DIAGNOSIS — Z720 Tobacco use: Secondary | ICD-10-CM | POA: Diagnosis not present

## 2019-06-01 DIAGNOSIS — M79601 Pain in right arm: Secondary | ICD-10-CM | POA: Diagnosis not present

## 2019-06-01 DIAGNOSIS — R000 Tachycardia, unspecified: Secondary | ICD-10-CM | POA: Diagnosis not present

## 2019-06-01 DIAGNOSIS — M79603 Pain in arm, unspecified: Secondary | ICD-10-CM | POA: Diagnosis not present

## 2019-06-01 DIAGNOSIS — Z79899 Other long term (current) drug therapy: Secondary | ICD-10-CM | POA: Diagnosis not present

## 2019-06-01 DIAGNOSIS — R410 Disorientation, unspecified: Secondary | ICD-10-CM | POA: Diagnosis not present

## 2019-06-01 DIAGNOSIS — Z888 Allergy status to other drugs, medicaments and biological substances status: Secondary | ICD-10-CM | POA: Diagnosis not present

## 2019-06-28 DIAGNOSIS — R200 Anesthesia of skin: Secondary | ICD-10-CM | POA: Diagnosis not present

## 2019-06-28 DIAGNOSIS — Z888 Allergy status to other drugs, medicaments and biological substances status: Secondary | ICD-10-CM | POA: Diagnosis not present

## 2019-06-28 DIAGNOSIS — R079 Chest pain, unspecified: Secondary | ICD-10-CM | POA: Diagnosis not present

## 2019-06-28 DIAGNOSIS — R1111 Vomiting without nausea: Secondary | ICD-10-CM | POA: Diagnosis not present

## 2019-06-28 DIAGNOSIS — R0789 Other chest pain: Secondary | ICD-10-CM | POA: Diagnosis not present

## 2019-06-28 DIAGNOSIS — R531 Weakness: Secondary | ICD-10-CM | POA: Diagnosis not present

## 2019-06-28 DIAGNOSIS — I499 Cardiac arrhythmia, unspecified: Secondary | ICD-10-CM | POA: Diagnosis not present

## 2019-06-28 DIAGNOSIS — R4781 Slurred speech: Secondary | ICD-10-CM | POA: Diagnosis not present

## 2020-09-24 IMAGING — MR MRI HEAD WITHOUT CONTRAST
9 of 10 series · 40 of 48 positions shown · non-contrast
Comparison: Cervical spine MRI today reported separately. Head CT
without contrast 0763 hours today.

CLINICAL DATA: 68-year-old male with right hand numbness and
paresthesia on waking. Also dysarthria and left leg paresthesia.

EXAM:
MRI HEAD WITHOUT CONTRAST
TECHNIQUE: Multiplanar, multiecho pulse sequences of the brain and surrounding
structures were obtained without intravenous contrast.

[Series 3: T1 · sagittal · 5.0mm · 0.47mm/px · 2 of 21 slices shown (1 of 2)]
[im 1/21]
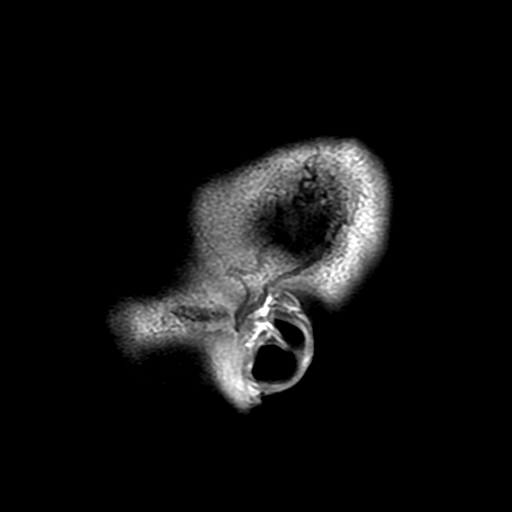
[im 21/21]
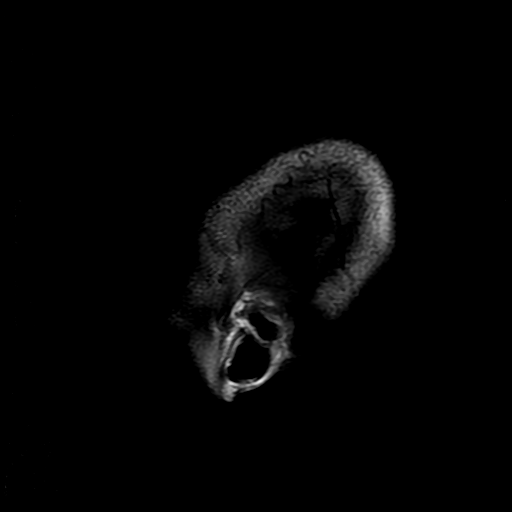

[Series 4: DWI · axial · 3.0mm · 1.09mm/px · z∈[-62,+91]mm · 9 of 104 slices shown (1 of 4)]
[im 1/104]
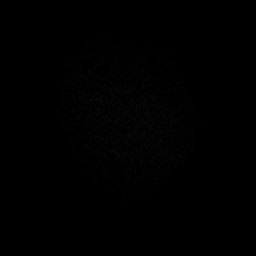
[im 13/104]
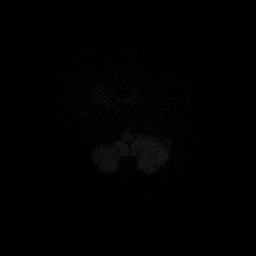
[im 26/104]
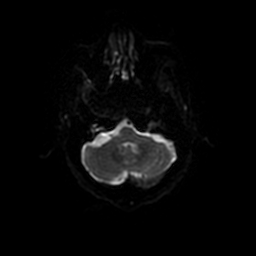
[im 39/104]
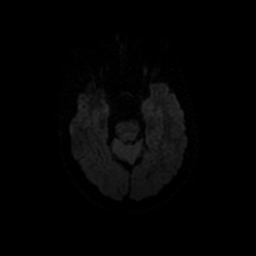
[im 52/104]
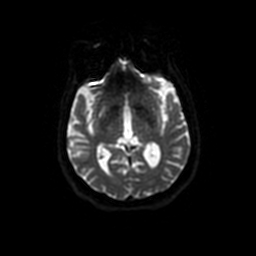
[im 65/104]
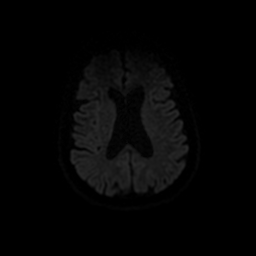
[im 78/104]
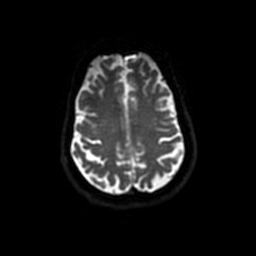
[im 91/104]
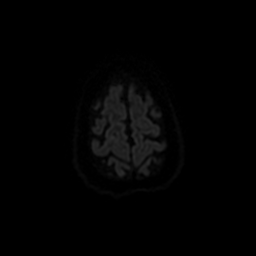
[im 104/104]
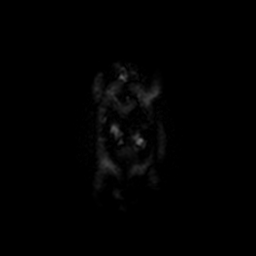

[Series 5: T2 · axial · 5.0mm · 0.86mm/px · z∈[-62,+88]mm · 2 of 26 slices shown (1 of 2)]
[im 1/26]
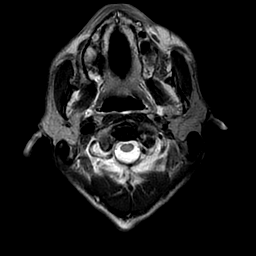
[im 26/26]
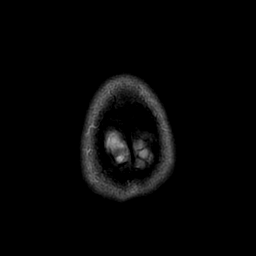

[Series 6: FLAIR · axial · 3.0mm · 0.86mm/px · z∈[-62,+88]mm · 2 of 26 slices shown]
[im 1/26]
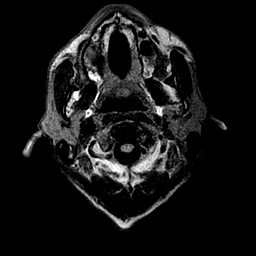
[im 26/26]
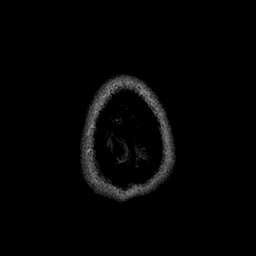

[Series 8: T1 · axial · 1.0mm · 0.47mm/px · z∈[-58,+89]mm · 8 of 148 slices shown (2 of 2)]
[im 1/148]
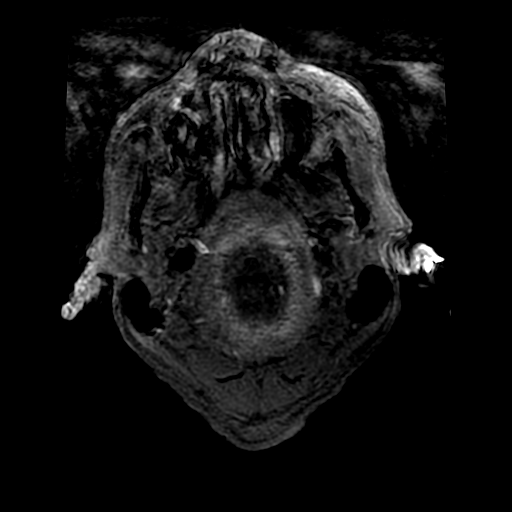
[im 25/148]
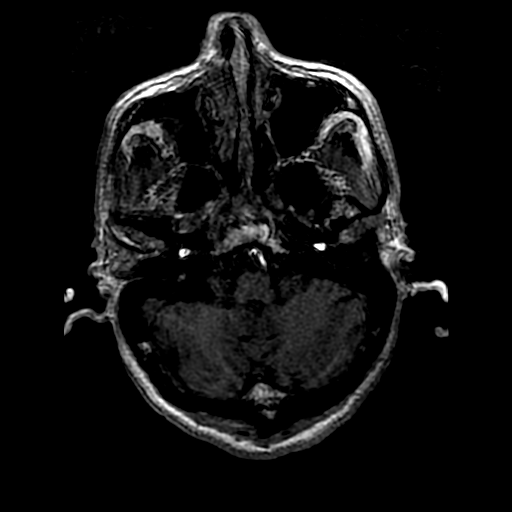
[im 50/148]
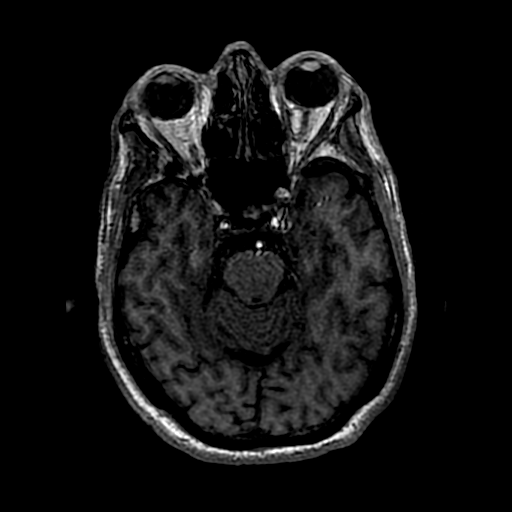
[im 62/148]
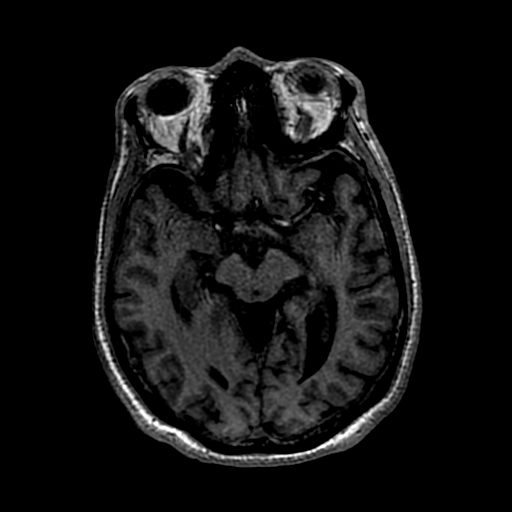
[im 86/148]
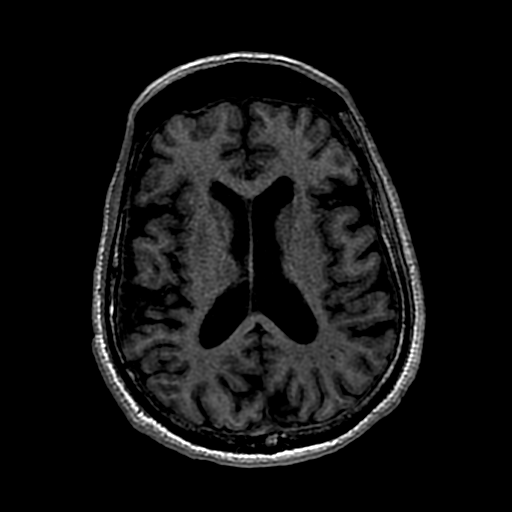
[im 99/148]
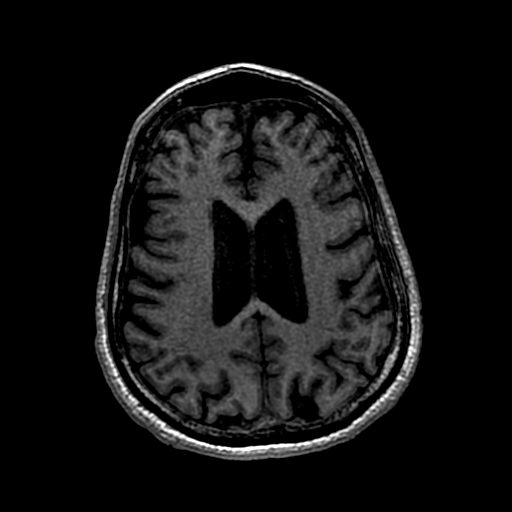
[im 123/148]
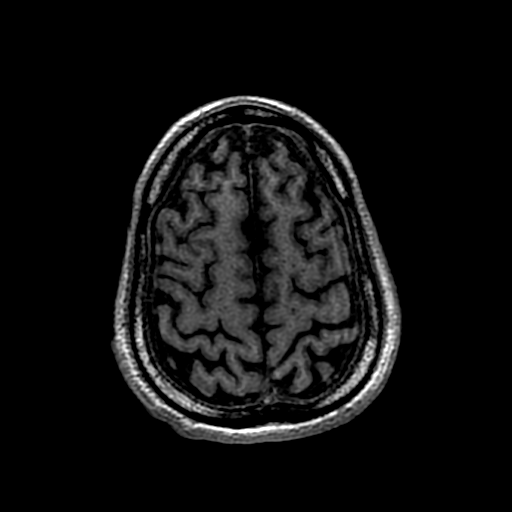
[im 148/148]
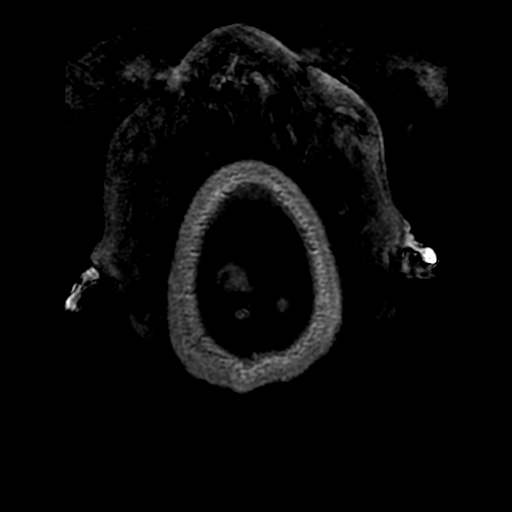

[Series 9: DWI · coronal · 4.0mm · 1.09mm/px · 7 of 82 slices shown (2 of 4)]
[im 1/82]
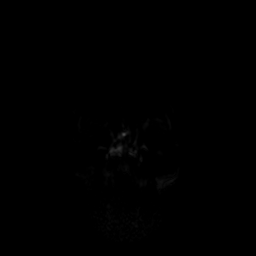
[im 14/82]
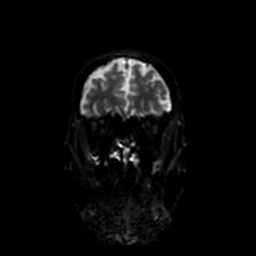
[im 28/82]
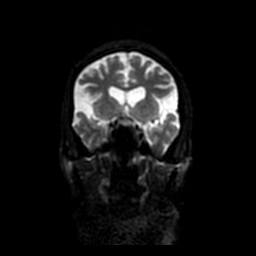
[im 41/82]
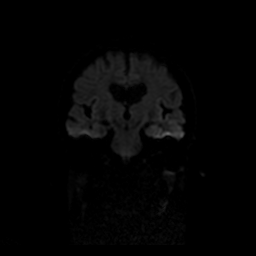
[im 55/82]
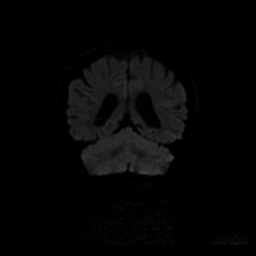
[im 68/82]
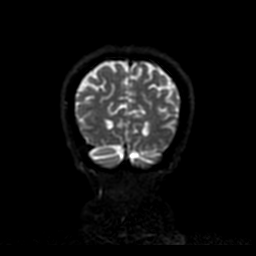
[im 82/82]
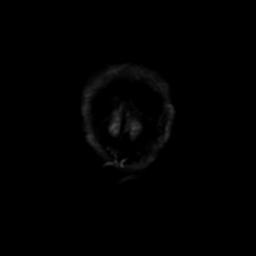

[Series 10: T2 · coronal · 5.0mm · 0.90mm/px · 2 of 26 slices shown (2 of 2)]
[im 1/26]
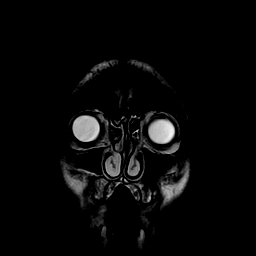
[im 26/26]
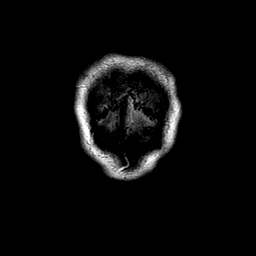

[Series 400: DWI · axial · 3.0mm · 1.09mm/px · z∈[-62,+91]mm · 4 of 52 slices shown (3 of 4)]
[im 1/52]
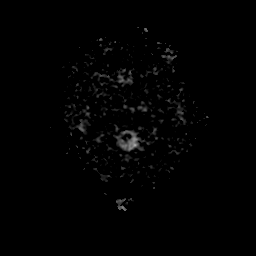
[im 18/52]
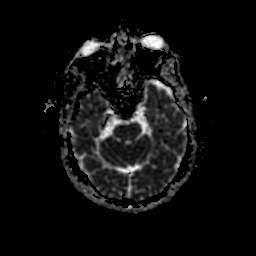
[im 35/52]
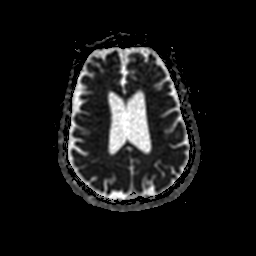
[im 52/52]
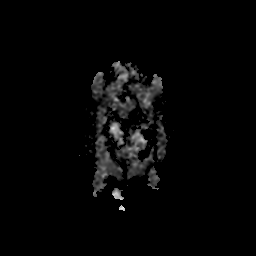

[Series 900: DWI · coronal · 4.0mm · 1.09mm/px · 4 of 41 slices shown (4 of 4)]
[im 1/41]
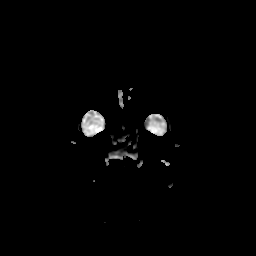
[im 14/41]
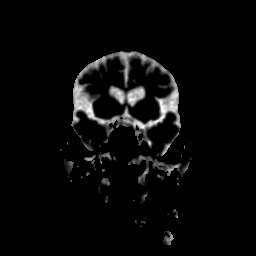
[im 27/41]
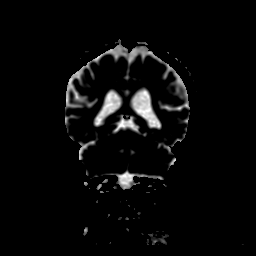
[im 41/41]
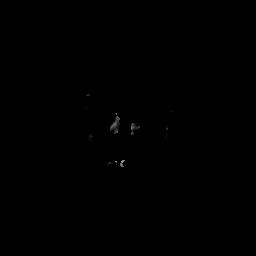

[40 of 48 positions shown; findings below may reference images not displayed]

FINDINGS: Brain: No restricted diffusion or evidence of acute infarction.

No midline shift, mass effect, evidence of mass lesion,
ventriculomegaly, extra-axial collection or acute intracranial
hemorrhage. Cervicomedullary junction and pituitary are within
normal limits.

Gray and white matter signal is within normal limits throughout the
brain. No encephalomalacia or chronic blood products identified.
Cerebral volume seems within normal limits for age.

Vascular: Major intracranial vascular flow voids are preserved.
Pneumatized anterior and posterior clinoid processes (normal
variant).

Skull and upper cervical spine: Cervical spine is reported
separately today. Skull bone marrow signal is normal.

Sinuses/Orbits: Negative orbits. Hyperplastic paranasal sinuses.
Mild right maxillary sinus mucosal thickening or retention cyst.

Other: Mastoids are clear. Visible internal auditory structures
appear normal.

Scalp and face soft tissues appear negative.
IMPRESSION: 1. No acute intracranial abnormality. See Cervical spine findings
today reported separately.
2. Normal for age noncontrast MRI appearance of the brain.
